# Patient Record
Sex: Male | Born: 1970 | Race: Black or African American | Hispanic: No | Marital: Single | Smoking: Never smoker
Health system: Southern US, Community
[De-identification: ages and names within clinical notes are randomized; demographics above are authoritative.]

## PROBLEM LIST (undated history)

## (undated) DIAGNOSIS — E785 Hyperlipidemia, unspecified: Secondary | ICD-10-CM

## (undated) DIAGNOSIS — I2510 Atherosclerotic heart disease of native coronary artery without angina pectoris: Secondary | ICD-10-CM

## (undated) DIAGNOSIS — I219 Acute myocardial infarction, unspecified: Secondary | ICD-10-CM

## (undated) DIAGNOSIS — I10 Essential (primary) hypertension: Secondary | ICD-10-CM

## (undated) DIAGNOSIS — K219 Gastro-esophageal reflux disease without esophagitis: Secondary | ICD-10-CM

## (undated) HISTORY — DX: Atherosclerotic heart disease of native coronary artery without angina pectoris: I25.10

## (undated) HISTORY — DX: Gastro-esophageal reflux disease without esophagitis: K21.9

## (undated) HISTORY — DX: Essential (primary) hypertension: I10

## (undated) HISTORY — DX: Acute myocardial infarction, unspecified: I21.9

## (undated) HISTORY — DX: Hyperlipidemia, unspecified: E78.5

## (undated) HISTORY — PX: CARDIAC CATHETERIZATION: SHX172

---

## 2013-09-20 ENCOUNTER — Emergency Department: Payer: Self-pay | Admitting: Emergency Medicine

## 2013-09-20 LAB — URINALYSIS, COMPLETE
Bacteria: NONE SEEN
Bilirubin,UR: NEGATIVE
Glucose,UR: NEGATIVE mg/dL (ref 0–75)
Ketone: NEGATIVE
NITRITE: NEGATIVE
Ph: 5 (ref 4.5–8.0)
Protein: NEGATIVE
RBC,UR: 1 /HPF (ref 0–5)
SPECIFIC GRAVITY: 1.016 (ref 1.003–1.030)
WBC UR: 11 /HPF (ref 0–5)

## 2013-09-20 LAB — CBC WITH DIFFERENTIAL/PLATELET
Basophil #: 0.1 10*3/uL (ref 0.0–0.1)
Basophil %: 1.1 %
Eosinophil #: 0.3 10*3/uL (ref 0.0–0.7)
Eosinophil %: 3.8 %
HCT: 51 % (ref 40.0–52.0)
HGB: 16.6 g/dL (ref 13.0–18.0)
LYMPHS ABS: 2.5 10*3/uL (ref 1.0–3.6)
Lymphocyte %: 37 %
MCH: 29.8 pg (ref 26.0–34.0)
MCHC: 32.6 g/dL (ref 32.0–36.0)
MCV: 92 fL (ref 80–100)
Monocyte #: 0.9 x10 3/mm (ref 0.2–1.0)
Monocyte %: 13.3 %
Neutrophil #: 3 10*3/uL (ref 1.4–6.5)
Neutrophil %: 44.8 %
Platelet: 243 10*3/uL (ref 150–440)
RBC: 5.58 10*6/uL (ref 4.40–5.90)
RDW: 13.7 % (ref 11.5–14.5)
WBC: 6.7 10*3/uL (ref 3.8–10.6)

## 2013-09-20 LAB — BASIC METABOLIC PANEL
ANION GAP: 8 (ref 7–16)
BUN: 14 mg/dL (ref 7–18)
CHLORIDE: 106 mmol/L (ref 98–107)
CREATININE: 1.13 mg/dL (ref 0.60–1.30)
Calcium, Total: 8.8 mg/dL (ref 8.5–10.1)
Co2: 23 mmol/L (ref 21–32)
EGFR (Non-African Amer.): >60
Glucose: 89 mg/dL (ref 65–99)
Osmolality: 274 (ref 275–301)
Potassium: 3.6 mmol/L (ref 3.5–5.1)
SODIUM: 137 mmol/L (ref 136–145)

## 2013-10-28 ENCOUNTER — Ambulatory Visit: Payer: Self-pay | Admitting: Family Medicine

## 2014-01-25 HISTORY — PX: CARDIAC CATHETERIZATION: SHX172

## 2014-02-04 LAB — BASIC METABOLIC PANEL
ANION GAP: 10 (ref 7–16)
BUN: 13 mg/dL (ref 7–18)
CHLORIDE: 107 mmol/L (ref 98–107)
Calcium, Total: 8.1 mg/dL — ABNORMAL LOW (ref 8.5–10.1)
Co2: 23 mmol/L (ref 21–32)
Creatinine: 1.21 mg/dL (ref 0.60–1.30)
EGFR (African American): >60
EGFR (Non-African Amer.): >60
Glucose: 156 mg/dL — ABNORMAL HIGH (ref 65–99)
Osmolality: 283 (ref 275–301)
Potassium: 3.4 mmol/L — ABNORMAL LOW (ref 3.5–5.1)
SODIUM: 140 mmol/L (ref 136–145)

## 2014-02-04 LAB — CBC
HCT: 45.2 % (ref 40.0–52.0)
HGB: 14.7 g/dL (ref 13.0–18.0)
MCH: 29.5 pg (ref 26.0–34.0)
MCHC: 32.5 g/dL (ref 32.0–36.0)
MCV: 91 fL (ref 80–100)
Platelet: 234 10*3/uL (ref 150–440)
RBC: 4.98 10*6/uL (ref 4.40–5.90)
RDW: 13.6 % (ref 11.5–14.5)
WBC: 6.9 10*3/uL (ref 3.8–10.6)

## 2014-02-04 LAB — TROPONIN I: Troponin-I: 0.23 ng/mL — ABNORMAL HIGH

## 2014-02-05 ENCOUNTER — Inpatient Hospital Stay: Payer: Self-pay | Admitting: Internal Medicine

## 2014-02-05 DIAGNOSIS — I10 Essential (primary) hypertension: Secondary | ICD-10-CM

## 2014-02-05 DIAGNOSIS — I214 Non-ST elevation (NSTEMI) myocardial infarction: Secondary | ICD-10-CM

## 2014-02-05 DIAGNOSIS — I517 Cardiomegaly: Secondary | ICD-10-CM

## 2014-02-05 DIAGNOSIS — I2510 Atherosclerotic heart disease of native coronary artery without angina pectoris: Secondary | ICD-10-CM

## 2014-02-05 LAB — TROPONIN I
TROPONIN-I: 0.23 ng/mL — AB
Troponin-I: 0.22 ng/mL — ABNORMAL HIGH

## 2014-02-05 LAB — HEMOGLOBIN A1C: HEMOGLOBIN A1C: 6 % (ref 4.2–6.3)

## 2014-02-06 LAB — HEMOGLOBIN A1C: HEMOGLOBIN A1C: 6.1 % (ref 4.2–6.3)

## 2014-02-06 LAB — BASIC METABOLIC PANEL
Anion Gap: 6 — ABNORMAL LOW (ref 7–16)
BUN: 14 mg/dL (ref 7–18)
CREATININE: 1.19 mg/dL (ref 0.60–1.30)
Calcium, Total: 8.7 mg/dL (ref 8.5–10.1)
Chloride: 108 mmol/L — ABNORMAL HIGH (ref 98–107)
Co2: 25 mmol/L (ref 21–32)
EGFR (Non-African Amer.): >60
Glucose: 112 mg/dL — ABNORMAL HIGH (ref 65–99)
Osmolality: 279 (ref 275–301)
POTASSIUM: 4.1 mmol/L (ref 3.5–5.1)
SODIUM: 139 mmol/L (ref 136–145)

## 2014-02-09 ENCOUNTER — Encounter: Payer: Self-pay | Admitting: *Deleted

## 2014-02-21 ENCOUNTER — Encounter: Payer: Self-pay | Admitting: Cardiovascular Disease

## 2014-02-21 ENCOUNTER — Ambulatory Visit (INDEPENDENT_AMBULATORY_CARE_PROVIDER_SITE_OTHER): Payer: Medicaid Other | Admitting: Cardiovascular Disease

## 2014-02-21 VITALS — BP 124/98 | HR 57 | Ht 71.0 in | Wt 224.0 lb

## 2014-02-21 DIAGNOSIS — Z9889 Other specified postprocedural states: Secondary | ICD-10-CM

## 2014-02-21 DIAGNOSIS — I2510 Atherosclerotic heart disease of native coronary artery without angina pectoris: Secondary | ICD-10-CM

## 2014-02-21 DIAGNOSIS — I10 Essential (primary) hypertension: Secondary | ICD-10-CM | POA: Insufficient documentation

## 2014-02-21 DIAGNOSIS — E785 Hyperlipidemia, unspecified: Secondary | ICD-10-CM | POA: Insufficient documentation

## 2014-02-21 NOTE — Progress Notes (Signed)
Primary care physician: Dr. Letta PateAycock  HPI  This is a pleasant 43 year old African-American male who is here today for follow-up visit after recent hospitalization [facility] for a small non-ST elevation myocardial infarction. He has known history of hypertension and hyperlipidemia. He was under the impression that he received 2 cardiac stents in the past in a different state. However, recent cardiac catheterization showed no evidence of prior stenting. He presented with chest pain and mildly elevated troponin consistent with a small non-ST elevation myocardial infarction. Echocardiogram showed normal LV systolic function with no significant valvular abnormalities. I proceeded with cardiac catheterization via the right radial artery which showed no evidence of obstructive coronary artery disease. There was significant coronary ectasia especially involving the right coronary artery with sluggish coronary flow. He was treated with dual antiplatelet therapy. The dose of atorvastatin was increased. He has been doing well since hospital discharge and denies any recurrent chest pain or dyspnea. 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  No Known Allergies   Current Outpatient Prescriptions on File Prior to Visit  Medication Sig Dispense Refill  . amLODipine (NORVASC) 5 MG tablet Take 5 mg by mouth daily.    Marland Kitchen. aspirin 81 MG tablet Take 81 mg by mouth daily.    Marland Kitchen. atorvastatin (LIPITOR) 80 MG tablet Take 80 mg by mouth daily.    . metoprolol (LOPRESSOR) 50 MG tablet Take 50 mg by mouth every 12 (twelve) hours.    . nitroGLYCERIN (NITROSTAT) 0.4 MG SL tablet Place 0.4 mg under the tongue every 5 (five) minutes as needed for chest pain.    Marland Kitchen. omeprazole (PRILOSEC) 20 MG capsule Take 20 mg by mouth 2 (two) times daily before a meal.    . ticagrelor (BRILINTA) 90 MG TABS tablet Take 90 mg by mouth every 12 (twelve) hours.     No current facility-administered medications on file prior to visit.     Past  Medical History  Diagnosis Date  . MI (myocardial infarction)   . Gastro-esophageal reflux   . Coronary artery disease     Small non-ST elevation myocardial infarction in December 2015. Cardiac catheterization showed no evidence of obstructive coronary artery disease. Significant coronary ectasia was noted especially in the right coronary artery with TIMI 2 flow. Ejection fraction was 55%.  Marland Kitchen. Hyperlipidemia   . Essential hypertension      Past Surgical History  Procedure Laterality Date  . Cardiac catheterization      [facility]  . Cardiac catheterization  01/2014     Family History  Problem Relation Age of Onset  . Heart disease Mother      History   Social History  . Marital Status: Single    Spouse Name: N/A    Number of Children: N/A  . Years of Education: N/A   Occupational History  . Not on file.   Social History Main Topics  . Smoking status: Never Smoker   . Smokeless tobacco: Not on file  . Alcohol Use: Yes     Comment: socially  . Drug Use: No  . Sexual Activity: Not on file   Other Topics Concern  . Not on file   Social History Narrative      PHYSICAL EXAM   BP 124/98 mmHg  Pulse 57  Ht 5\' 11"  (1.803 m)  Wt 224 lb (101.606 kg)  BMI 31.26 kg/m2 Constitutional: He is oriented to person, place, and time. He appears well-developed and  well-nourished. No distress.  HENT: No nasal discharge.  Head: Normocephalic and atraumatic.  Eyes: Pupils are equal and round.  No discharge. Neck: Normal range of motion. Neck supple. No JVD present. No thyromegaly present.  Cardiovascular: Normal rate, regular rhythm, normal heart sounds. Exam reveals no gallop and no friction rub. No murmur heard.  Pulmonary/Chest: Effort normal and breath sounds normal. No stridor. No respiratory distress. He has no wheezes. He has no rales. He exhibits no tenderness.  Abdominal: Soft. Bowel sounds are normal. He exhibits no  distension. There is no tenderness. There is no rebound and no guarding.  Musculoskeletal: Normal range of motion. He exhibits no edema and no tenderness.  Neurological: He is alert and oriented to person, place, and time. Coordination normal.  Skin: Skin is warm and dry. No rash noted. He is not diaphoretic. No erythema. No pallor.  Psychiatric: He has a normal mood and affect. His behavior is normal. Judgment and thought content normal.  Right radial pulse is normal with no hematoma     NWG:NFAOZEKG:Sinus  Bradycardia  -  Nonspecific T-abnormality.   ABNORMAL     ASSESSMENT AND PLAN

## 2014-02-21 NOTE — Assessment & Plan Note (Signed)
The dose of atorvastatin was recently increased. I recommend a follow-up lipid and liver profile  in one month.

## 2014-02-21 NOTE — Assessment & Plan Note (Signed)
Blood pressure is well controlled on current medications. 

## 2014-02-21 NOTE — Assessment & Plan Note (Signed)
He is overall doing reasonably well since hospital discharge with no recurrent symptoms suggestive of angina. I recommend aggressive control of risk factors. Continue dual antiplatelet therapy for at least one year.

## 2014-02-21 NOTE — Patient Instructions (Signed)
Your physician recommends that you return for lab work in:  Fasting lipid and liver panel in 4 weeks   Your physician recommends that you schedule a follow-up appointment in:  3 months with Dr. Kirke CorinArida

## 2014-03-21 ENCOUNTER — Other Ambulatory Visit (INDEPENDENT_AMBULATORY_CARE_PROVIDER_SITE_OTHER): Payer: Medicaid Other | Admitting: *Deleted

## 2014-03-21 DIAGNOSIS — E785 Hyperlipidemia, unspecified: Secondary | ICD-10-CM

## 2014-03-22 LAB — HEPATIC FUNCTION PANEL
ALBUMIN: 4.7 g/dL (ref 3.5–5.5)
ALK PHOS: 59 IU/L (ref 39–117)
ALT: 25 IU/L (ref 0–44)
AST: 21 IU/L (ref 0–40)
Bilirubin, Direct: 0.1 mg/dL (ref 0.00–0.40)
Total Bilirubin: 0.3 mg/dL (ref 0.0–1.2)
Total Protein: 7.9 g/dL (ref 6.0–8.5)

## 2014-03-22 LAB — LIPID PANEL
CHOLESTEROL TOTAL: 200 mg/dL — AB (ref 100–199)
Chol/HDL Ratio: 4.7 ratio units (ref 0.0–5.0)
HDL: 43 mg/dL (ref >39)
LDL Calculated: 131 mg/dL — ABNORMAL HIGH (ref 0–99)
Triglycerides: 130 mg/dL (ref 0–149)
VLDL Cholesterol Cal: 26 mg/dL (ref 5–40)

## 2014-05-17 ENCOUNTER — Encounter: Payer: Self-pay | Admitting: Cardiovascular Disease

## 2014-05-17 ENCOUNTER — Ambulatory Visit (INDEPENDENT_AMBULATORY_CARE_PROVIDER_SITE_OTHER): Payer: Medicaid Other | Admitting: Cardiovascular Disease

## 2014-05-17 VITALS — BP 128/100 | HR 72 | Ht 71.0 in | Wt 228.8 lb

## 2014-05-17 DIAGNOSIS — I10 Essential (primary) hypertension: Secondary | ICD-10-CM | POA: Diagnosis not present

## 2014-05-17 DIAGNOSIS — E785 Hyperlipidemia, unspecified: Secondary | ICD-10-CM

## 2014-05-17 DIAGNOSIS — I2510 Atherosclerotic heart disease of native coronary artery without angina pectoris: Secondary | ICD-10-CM

## 2014-05-17 MED ORDER — CLOPIDOGREL BISULFATE 75 MG PO TABS: 75.0000 mg | ORAL_TABLET | Freq: Every day | ORAL | Status: DC

## 2014-05-17 NOTE — Patient Instructions (Signed)
Stop taking Brilinta.  Start Plavix 75 mg once daily.   Follow a low fat low carb diet. Come fasting next visit in order to do labs.   Your physician wants you to follow-up in: 6 months.  You will receive a reminder letter in the mail two months in advance. If you don't receive a letter, please call our office to schedule the follow-up appointment.

## 2014-05-17 NOTE — Progress Notes (Signed)
Primary care physician: Dr. Letta Pate  HPI  This is a pleasant 44 year old African-American male who is here today for a follow-up visit regarding coronary artery disease.  He has known history of hypertension and hyperlipidemia. He was under the impression that he received 2 cardiac stents in the past in a different state. However, recent cardiac catheterization showed no evidence of prior stenting. He was hospitalized in December 2015 at [facility] for chest pain and mildly elevated troponin . Echocardiogram showed normal LV systolic function with no significant valvular abnormalities. Cardiac catheterization showed no evidence of obstructive coronary artery disease. There was significant coronary ectasia especially involving the right coronary artery with sluggish coronary flow. He was treated with dual antiplatelet therapy. The dose of atorvastatin was increased.  He has been doing very well since then with no recurrent chest pain or shortness of breath. He forgets to take the PM doses of his medications including metoprolol and Brilinta.   No Known Allergies   Current Outpatient Prescriptions on File Prior to Visit  Medication Sig Dispense Refill  . amLODipine (NORVASC) 5 MG tablet Take 5 mg by mouth daily.    Marland Kitchen aspirin 81 MG tablet Take 81 mg by mouth daily.    Marland Kitchen atorvastatin (LIPITOR) 80 MG tablet Take 80 mg by mouth daily.    . metoprolol (LOPRESSOR) 50 MG tablet Take 50 mg by mouth daily.     . nitroGLYCERIN (NITROSTAT) 0.4 MG SL tablet Place 0.4 mg under the tongue every 5 (five) minutes as needed for chest pain.    Marland Kitchen omeprazole (PRILOSEC) 20 MG capsule Take 20 mg by mouth 2 (two) times daily before a meal.    . ticagrelor (BRILINTA) 90 MG TABS tablet Take 90 mg by mouth daily.      No current facility-administered medications on file prior to visit.     Past Medical History  Diagnosis Date  . MI (myocardial infarction)   . Gastro-esophageal reflux   . Coronary artery disease    Small non-ST elevation myocardial infarction in December 2015. Cardiac catheterization showed no evidence of obstructive coronary artery disease. Significant coronary ectasia was noted especially in the right coronary artery with TIMI 2 flow. Ejection fraction was 55%.  Marland Kitchen Hyperlipidemia   . Essential hypertension      Past Surgical History  Procedure Laterality Date  . Cardiac catheterization      x1 stent; [facility]  . Cardiac catheterization  01/2014     Family History  Problem Relation Age of Onset  . Heart disease Mother      History   Social History  . Marital Status: Single    Spouse Name: N/A  . Number of Children: N/A  . Years of Education: N/A   Occupational History  . Not on file.   Social History Main Topics  . Smoking status: Never Smoker   . Smokeless tobacco: Not on file  . Alcohol Use: Yes     Comment: socially  . Drug Use: No  . Sexual Activity: Not on file   Other Topics Concern  . Not on file   Social History Narrative      PHYSICAL EXAM   BP 128/100 mmHg  Pulse 72  Ht  (1.803 m)  Wt 228 lb 12 oz (103.76 kg)  BMI 31.92 kg/m2 Constitutional: He is oriented to person, place, and time. He appears well-developed and well-nourished. No distress.  HENT: No nasal discharge.  Head: Normocephalic and atraumatic.  Eyes: Pupils are equal and round.  No discharge. Neck: Normal range of motion. Neck supple. No JVD present. No thyromegaly present.  Cardiovascular: Normal rate, regular rhythm, normal heart sounds. Exam reveals no gallop and no friction rub. No murmur heard.  Pulmonary/Chest: Effort normal and breath sounds normal. No stridor. No respiratory distress. He has no wheezes. He has no rales. He exhibits no tenderness.  Abdominal: Soft. Bowel sounds are normal. He exhibits no distension. There is no tenderness. There is no rebound and no guarding.  Musculoskeletal: Normal range of motion. He exhibits no  edema and no tenderness.  Neurological: He is alert and oriented to person, place, and time. Coordination normal.  Skin: Skin is warm and dry. No rash noted. He is not diaphoretic. No erythema. No pallor.  Psychiatric: He has a normal mood and affect. His behavior is normal. Judgment and thought content normal.         ASSESSMENT AND PLAN

## 2014-05-18 NOTE — Assessment & Plan Note (Signed)
He is doing very well with no symptoms suggestive of angina. Continue medical therapy. I switched him from Brilinta to Plavix 75 mg once daily.

## 2014-05-18 NOTE — Assessment & Plan Note (Signed)
Blood pressure is reasonably controlled on current medications. 

## 2014-05-18 NOTE — Assessment & Plan Note (Signed)
Last lipid profile was not optimal in spite of maximal dose Atorvastatin. I discussed with him the importance of diet and exercise. Repeat labs in 6 months. If no improvement , I will plan on adding Zetia.

## 2014-05-21 ENCOUNTER — Emergency Department: Payer: Self-pay | Admitting: Emergency Medicine

## 2014-05-21 LAB — BASIC METABOLIC PANEL
ANION GAP: 8 (ref 7–16)
BUN: 15 mg/dL
CALCIUM: 8.9 mg/dL
CO2: 25 mmol/L
CREATININE: 1.04 mg/dL
Chloride: 104 mmol/L
GLUCOSE: 113 mg/dL — AB
POTASSIUM: 3.6 mmol/L
Sodium: 137 mmol/L

## 2014-05-21 LAB — CBC WITH DIFFERENTIAL/PLATELET
Basophil #: 0.1 10*3/uL (ref 0.0–0.1)
Basophil %: 1 %
EOS PCT: 2.8 %
Eosinophil #: 0.2 10*3/uL (ref 0.0–0.7)
HCT: 45.2 % (ref 40.0–52.0)
HGB: 14.8 g/dL (ref 13.0–18.0)
LYMPHS PCT: 28.1 %
Lymphocyte #: 2.1 10*3/uL (ref 1.0–3.6)
MCH: 29.2 pg (ref 26.0–34.0)
MCHC: 32.8 g/dL (ref 32.0–36.0)
MCV: 89 fL (ref 80–100)
MONOS PCT: 10.2 %
Monocyte #: 0.8 x10 3/mm (ref 0.2–1.0)
NEUTROS ABS: 4.4 10*3/uL (ref 1.4–6.5)
Neutrophil %: 57.9 %
Platelet: 232 10*3/uL (ref 150–440)
RBC: 5.08 10*6/uL (ref 4.40–5.90)
RDW: 14.2 % (ref 11.5–14.5)
WBC: 7.6 10*3/uL (ref 3.8–10.6)

## 2014-05-21 LAB — TROPONIN I
TROPONIN-I: 0.05 ng/mL — AB
TROPONIN-I: 0.07 ng/mL — AB

## 2014-05-23 ENCOUNTER — Telehealth: Payer: Self-pay | Admitting: *Deleted

## 2014-05-23 NOTE — Telephone Encounter (Signed)
Request for surgical clearance:  1. What type of surgery is being performed? Micro disctec laryngoscopy with biopsy   2. When is this surgery scheduled? 05/25/14 (it was moved up)  3. Are there any medications that need to be held prior to surgery and how long? Will be off all Asprin and any blood thinner  4. Name of physician performing surgery? Dr Andee Polesvaught  5. What is your office phone and fax number? [phone] FAX: [phone].

## 2014-05-23 NOTE — Telephone Encounter (Signed)
Clearance letter faxed to [facility]

## 2014-05-23 NOTE — Telephone Encounter (Signed)
Cardiac clearance letter faxed to [facility]

## 2014-06-02 ENCOUNTER — Ambulatory Visit
Admit: 2014-06-02 | Disposition: A | Discharge status: Home / Self Care | Payer: Self-pay | Attending: Otolaryngology | Admitting: Otolaryngology

## 2014-06-18 NOTE — Discharge Summary (Signed)
PATIENT NAME:  Adrian CampbellMITCHELL, Rasmus S MR#:  [id number] DATE OF BIRTH:  Jan 08, 1971  DATE OF ADMISSION:  02/05/2014 DATE OF DISCHARGE:  02/07/2014  DISCHARGE DIAGNOSES: 1.  Chest pain secondary to non-ST-elevation myocardial infarction.  2.  Hypertension.   DISCHARGE MEDICATIONS: 1.  Aspirin 81 mg daily.  2.  Omeprazole 20 mg p.o. b.i.d. 3.  Amlodipine 5 mg daily. 4.  Nitroglycerin 0.4 mg sublingual every 5 minutes p.r.n. for chest pain as needed.  5.  Atorvastatin 80 mg p.o. daily. 6.  Metoprolol tartrate 50 mg p.o. b.i.d. 7.  Brilinta 90 mg every 12 hours.   DISCHARGE DIET: Low-sodium, low-fat diet.  DISCHARGE FOLLOWUP: Follow up with Dr. Kirke CorinArida in about 7 to 10 days. He has an appointment with Dr. Kirke CorinArida on December 28th at 3:15 p.m.  PRIMARY CARE PHYSICIAN: [facility].  CONSULTATIONS: Cardiology with Dr. Lorine BearsMuhammad Arida.  HOSPITAL COURSE: This is a 44 year old African American male with hypertension and coronary artery disease with stent placement 2 years ago who comes in because of chest pain for 3 days. The patient also had some trouble breathing with chest pain. The patient was given nitro by EMS with some relief of chest pain. He had a troponin elevation of 0.23 initially, and the patient admitted to medical service for chest pain, non-ST elevation MI. He was continued on aspirin, beta blockers and statins and admitted for acute coronary syndrome. The patient was seen by cardiology, from [facility]. He was continued on Lovenox, full dose, over the weekend, and he was taken to cardiac cath on Monday, December 14th. The patient's cardiac cath revealed EF of 55%, but sluggish flow in the carotid arteries seen so Dr. Kirke CorinArida recommended dual antiplatelet therapy with aspirin and Brilinta. The patient did not have any occlusive coronary artery disease. The patient was chest pain free on the day of admission itself. The patient's troponins were around 0.22 and did not  go up further than that. The patient's CBC and Chem-7 were within normal limits with normal kidney function. Hemoglobin A1c was 6. The patient's EF was 50% by echo, low normal systolic function. The patient was given a prescription for Brilinta and increased dose of Lipitor at 80 mg and also made an appointment to follow up with Dr. Kirke CorinArida. The patient takes amlodipine at 5 mg at home for the blood pressure and Lipitor 10 mg daily. We have increased Lipitor to 80 mg due to his findings on the cath and we have added metoprolol also to his blood pressure medication in addition to the amlodipine that he is taking. The patient's was tachycardic, 103, on admission. The patient felt better.  DISCHARGE PHYSICAL EXAMINATION:  DISCHARGE VITALS: Temperature 97.7, heart rate 54, blood pressure 143/80, sats 97% on room air. CARDIOVASCULAR: S1 and S2 regular.  LUNGS: Clear to auscultation. No wheeze. No rales.  ABDOMEN: Soft, nontender, nondistended. Bowel sounds present.   DISPOSITION: The patient went home in stable condition.  TIME SPENT ON DISCHARGE: More than 30 minutes. ____________________________ Katha HammingSnehalatha Breanda Greenlaw, MD sk:sb D: 02/08/2014 07:21:25 ET T: 02/08/2014 10:58:54 ET JOB#: [id number]  cc: Katha HammingSnehalatha Callahan Wild, MD, <Dictator> Muhammad A. Kirke CorinArida, MD Katha HammingSNEHALATHA Jennica Tagliaferri MD ELECTRONICALLY SIGNED 02/08/2014 22:55

## 2014-06-18 NOTE — H&P (Signed)
PATIENT NAME:  Adrian Terry, Adrian Terry MR#:  [id number] DATE OF BIRTH:  22-May-1970  DATE OF ADMISSION:  02/05/2014  REFERRING DOCTOR: Darien Ramus, MD  PRIMARY CARE DOCTOR:  Karie Fetch, MD of [facility].    ADMITTING DOCTOR:  Crissie Figures, MD   CHIEF COMPLAINT:  Chest pain ongoing for the past 3 days.    HISTORY OF PRESENT ILLNESS: A 44 year old African American male with past medical history significant for hypertension, coronary artery disease, status post stent placement 2 years ago, history of gastroesophageal reflux disease, hyperlipidemia who presents to the Emergency Room with the complaints of ongoing chest pain for the past 3 days. The patient states that he was in his usual state of health until 3 days ago when he started having retrosternal chest pain associated with some mild shortness of breath and nausea for which he has not sought any medical attention. He continued to have these chest pain episodes on and off for the past 3 days, which kind of increased today, hence he called EMS.  He took 1 nitroglycerin prior to the arrival of the EMS and he also received 1 more nitroglycerin tablet, which was given by the EMS, and also received aspirin following which his chest pain eased. He also complains of some tingling along the left arm with associated chest pain. No dizziness. No loss of consciousness. No diaphoresis. No history of any recent fever, cough. He does have some nausea but no vomiting, no diarrhea. No urinary symptoms.   In the Emergency Room, the patient was evaluated by the ED physician and was given nitroglycerin paste for his chest discomfort following which his chest discomfort completely  resolved. The patient also received some Zofran for the nausea and his nausea is under control.  At the current time, the patient states he is feeling better and denies any chest pain at this time. Workup in the Emergency Room by the ED physician revealed EKG with  a sinus tachycardia with ventricular rate of 105 beats per minute and nonspecific ST, T changes and troponin was mildly elevated with 0.23. Other labs were unremarkable except for potassium of 3.4. Hospitalist service was consulted for further evaluation and management.    PAST MEDICAL HISTORY:  1.  Hypertension.  2.  Coronary artery disease, status post stent placed 2 years ago.  3.  Gastroesophageal reflux disease.  4.  Hyperlipidemia.   PAST SURGICAL HISTORY: Nil significant.   ALLERGIES: No known drug allergies.   HOME MEDICATIONS:  1.  Amlodipine 5 mg tablet, 1 tablet orally once a day.  2.  Aspirin 81 mg, 1 tablet orally once a day.  3.  Lipitor 10 mg tablet, 1 tablet orally once a day.  4.  Nitroglycerin 0.4 mg subinguinal tablet, 1 tablet as needed for chest pain.  5.  Omeprazole 20 mg tablet, 1 tablet orally once a day.   FAMILY HISTORY: Mother significant for heart disease and diabetes mellitus and father with diabetes mellitus.    SOCIAL HISTORY: He is engaged. He works as an Personnel officer, an ex-smoker. He quit a few years ago but restarted recently and planning to quit again. History of occasional alcohol intake on weekends socially. No history of any substance abuse.    REVIEW OF SYSTEMS:  CONSTITUTIONAL: Negative for fever, fatigue, generalized weakness. No weight loss or weight gain abnormally lately.  EYES: Negative for blurred vision, double vision. No pain. No redness. No inflammation.  EARS, NOSE, AND THROAT: Negative  for tinnitus, ear pain, hearing loss, epistaxis, nasal discharge, difficulty swallowing.  RESPIRATORY: Negative for cough, wheezing, hemoptysis. He did have some mild shortness of breath during the chest pain episode. Otherwise, denies any shortness of breath or painful respiration.  CARDIOVASCULAR: Ongoing chest discomfort as noted in the HPI. No dizziness. No syncopal episodes. No palpitations.  GASTROINTESTINAL: Some mild nausea with the chest pain;  otherwise, no vomiting. No diarrhea. No abdominal pain. No hematemesis. No melena. History of gastroesophageal reflux disease controlled with daily omeprazole.  GENITOURINARY: Negative for dysuria, hematuria, frequency, urgency.  ENDOCRINE: Negative for polyuria, nocturia. No heat or cold intolerance.  HEMATOLOGIC: Negative for anemia, easy bruising, bleeding or swollen glands.  INTEGUMENTARY: Negative for acne, skin rash, lesions.  MUSCULOSKELETAL: Negative for any arthritis, joint swelling or gout.  NEUROLOGICAL: Negative for focal weakness or numbness. No history of CVA, TIA, seizure disorder.  PSYCHIATRIC: Negative for anxiety, depression or insomnia.   PHYSICAL EXAMINATION:  VITAL SIGNS: Temperature 98.5 degrees Fahrenheit, pulse rate 103 per minute, respirations 20, blood pressure 146/75;  O2 saturations 100% on room air.  GENERAL: Well-nourished, well-developed young male, alert and oriented, in no acute distress, comfortably resting in the bed, pleasant and cooperative.  HEAD: Atraumatic, normocephalic.  EYES: Pupils equal, reacting to light and accommodation, no conjunctival pallor, no scleral icterus, extraocular movements intact.  NOSE: No nasal lesions. No drainage.  EARS: No drainage. No external lesions.  ORAL CAVITY: No mucosal lesions. No exudates. No masses.   NECK: Supple. No JVD. No thyromegaly. No carotid bruit. Range of motion of neck normal.  RESPIRATORY: Good respiratory effort. Not using accessory muscles of respiration. Bilateral vesicular breath sounds. No rales or rhonchi.  CARDIOVASCULAR: Heart sounds regular. S1, S2 regular. No murmur, gallop, or clicks heard. Peripheral pulses equal at carotid, femoral, and pedal pulses. No peripheral edema.  GASTROINTESTINAL: Abdomen soft and nontender, bowel sounds present and equal in all 4 quadrants. No tenderness. No guarding. No rigidity. No hepatosplenomegaly.  GENITOURINARY: Deferred.  MUSCULOSKELETAL: No tenderness or  joint effusions. Range of motion adequate in all areas. Strength and tone equal bilaterally.  SKIN: Within normal limits. Well hydrated.  LYMPHATIC: No cervical lymphadenopathy.  VASCULAR: Good dorsalis pedis and posterior tibial pulses.  NEUROLOGICAL: Alert, awake, and oriented x 3. Cranial nerves II through XII grossly intact. DTRs  2+ bilateral and symmetrical in both upper and lower extremities. Motor strength 5/5 in both upper and lower extremities and symmetrical.  PSYCHIATRIC: Judgment, insight adequate. Alert and oriented x 3. Memory and mood intact, within normal limits.   LABORATORY DATA: Serum glucose 156, BUN 13, creatinine 1.21, serum sodium 140, potassium 3.4, serum chloride 107, bicarbonate 23, serum calcium 8.1, troponin 0.23, WBC 6.9, hemoglobin 14.7, hematocrit 45.2, platelet count 234.   IMAGING STUDY: Chest x-ray: No active disease.   EKG: Sinus tachycardia with a ventricular rate of 105 beats per minute, nonspecific ST, T wave changes.   ASSESSMENT AND PLAN: A 44 year old African American male with a past medical history significant for coronary artery disease, status post stent placement 2 years ago, history of hypertension, hyperlipidemia, gastroesophageal reflux disease, tobacco usage presents with the complaints of ongoing chest pain for the past 3-4 days.  1.  Chest pain ongoing for the past 3-4 days, which has typical and atypical characteristics. The patient has significant CAD risk factors including known coronary artery disease and hypertension, hyperlipidemia, tobacco usage, needs further evaluation to rule out acute coronary syndrome. Plan: Admit to telemetry, aspirin, nitroglycerin, beta  blocker, statin, cycle cardiac enzymes, order echocardiogram, and cardiology consultation for further evaluation.  2.  Elevated troponin, mild. Demand ischemia, rule out acute coronary syndrome. Plan: Admit to telemetry, cycle cardiac enzymes, aspirin, beta blocker, statin,  nitroglycerin, echocardiogram, and cardiology consultation.  3.  History of coronary artery disease, status post stent placement 2 years ago.  4.  Hypertension. BP controlled on home medications. Continue same.  5.  Hyperlipidemia, on statin. Continue same.  6.  History of gastroesophageal reflux disease, stable on proton pump inhibitor. Continue same.  7.  Tobacco usage, intermittent. Counseled to quit smoking. Offered nicotine replacement therapy. The patient defers at this time.  8.  Hypokalemia, mild. Replace potassium and follow BMP.  9.  Elevated blood sugars, family history of diabetes. Check hemoglobin A1c to evaluate for diabetes mellitus.  10.  Deep vein thrombosis prophylaxis, subcutaneous Lovenox.  11.  Gastrointestinal prophylaxis, proton pump inhibitor.  12.  Code status.  Full code.   TIME SPENT: 55 minutes.    ____________________________ Crissie Figures, MD enr:AT D: 02/05/2014 00:51:36 ET T: 02/05/2014 01:19:16 ET JOB#: [id number]  cc: Crissie Figures, MD, <Dictator> Karie Fetch, MD, [facility]     Crissie Figures MD ELECTRONICALLY SIGNED 02/05/2014 17:42

## 2014-06-18 NOTE — Consult Note (Signed)
General Aspect ast medical history significant for hypertension, coronary artery disease, status post stent placement 2 years ago, history of gastroesophageal reflux disease, hyperlipidemia who presents to the Emergency Room with the complaints of ongoing chest pain for the past 3 days.  initial presentation was with uncontolled hypertension  acknowledges compliance with meds and CPAP   Present Illness Has prior Stent 2 yrs ago without known heart muscle damage.  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  Hypertension.  2.  Coronary artery disease, status post stent placed 2 years ago.  3.  Gastroesophageal reflux disease.  4.  Hyperlipidemia.  5. treated with CPAP OSA  PAST SURGICAL HISTORY: Nil significant.   HOME MEDICATIONS:  1.  Amlodipine 5 mg tablet, 1 tablet orally once a day.  2.  Aspirin 81 mg, 1 tablet orally once a day.  3.  Lipitor 10 mg tablet, 1 tablet orally once a day.  4.  Nitroglycerin 0.4 mg subinguinal tablet, 1 tablet as needed for chest pain.  5.  Omeprazole 20 mg tablet, 1 tablet orally once a day.   FAMILY HISTORY: Mother significant for heart disease and diabetes mellitus and father with diabetes mellitus.    SOCIAL HISTORY: He is engaged. He works as an Personnel officerelectrician, an ex-smoker. He quit a few years ago but restarted recently and planning to quit again. History of occasional alcohol intake on weekends socially. No history of any substance abuse.    REVIEW OF SYSTEMS:  CONSTITUTIONAL: Negative for fever, fatigue, generalized weakness. No  weight loss or weight gain abnormally lately.  EYES: Negative for blurred vision, double vision. No pain. No redness. No inflammation.  EARS, NOSE, AND THROAT: Negative for tinnitus, ear pain, hearing loss, epistaxis, nasal discharge, difficulty swallowing.  RESPIRATORY: Negative for cough, wheezing, hemoptysis. He did have some mild shortness of breath during the chest pain episode. Otherwise, denies any shortness of breath or painful respiration.  CARDIOVASCULAR: Ongoing chest discomfort as noted in the HPI. No dizziness. No syncopal episodes. No palpitations.  GASTROINTESTINAL: Some mild nausea with the chest pain; otherwise, no vomiting. No diarrhea. No abdominal pain. No hematemesis. No melena. History of gastroesophageal reflux disease controlled with daily omeprazole.  GENITOURINARY: Negative for dysuria, hematuria, frequency, urgency.  ENDOCRINE: Negative for polyuria, nocturia. No heat or cold intolerance.  HEMATOLOGIC: Negative for anemia, easy bruising, bleeding or swollen glands.  INTEGUMENTARY: Negative for acne, skin rash, lesions.  MUSCULOSKELETAL: Negative for any arthritis, joint swelling or gout.  NEUROLOGICAL: Negative for focal weakness or numbness. No history of CVA, TIA,   Physical Exam:  GEN well developed, well nourished, no acute distress   HEENT PERRL   NECK supple  No masses   RESP normal resp effort  clear BS   CARD Regular rate and rhythm   ABD denies tenderness  soft   LYMPH negative neck   EXTR negative cyanosis/clubbing, negative edema   SKIN normal to palpation, No rashes, skin turgor good   NEURO grossly normla with all 4   PSYCH  alert, A+O to time, place, person   Review of Systems:  Subjective/Chief Complaint as above   Medications/Allergies Reviewed no allergies   Family & Social History:  Family and Social History:  Family History Non-Contributory  Coronary Artery Disease  Smoking   Social History positive  tobacco, positive   tobacco (Current within 1 year), negative ETOH   + Tobacco Current (within 1 year)   Place of Living Home   EKG:  EKG Interp. by me  NSR  nml qrs  no acute changes    No Known Allergies:   Vital Signs/Nurse's Notes: **Vital Signs.:   12-Dec-15 13:02  Vital Signs Type Routine  Temperature Temperature (F) 98.1  Pulse Pulse 73  Respirations Respirations 17  Systolic BP Systolic BP 120  Diastolic BP (mmHg) Diastolic BP (mmHg) 75  Mean BP 90  Pulse Ox % Pulse Ox % 95  Pulse Ox Activity Level  At rest  Oxygen Delivery Room Air/ 21 %    Impression NSTEMI CAD w PRIOR STENTING HTN GERD elevate BS   Plan agree with admission 1- heparin/ASA/BB 2- ticagrelor 3- BP control--much better already 4- cath on Monday 5- HgbA1c 6. will watn to simplify HTN regime 7. increase statin to 80 mg reviewed with patient   Electronic Signatures: Duke Salvia (MD)  (Signed 12-Dec-15 14:35)  Authored: General Aspect/Present Illness, History and Physical Exam, Review of System, Family & Social History, Home Medications, EKG , Allergies, Vital Signs/Nurse's Notes, Impression/Plan   Last Updated: 12-Dec-15 14:35 by Duke Salvia (MD)

## 2014-06-20 LAB — SURGICAL PATHOLOGY

## 2014-06-26 NOTE — Consult Note (Signed)
PATIENT NAME:  Adrian Terry, Adrian Terry MR#:  [id number] DATE OF BIRTH:  08-Dec-1970  DATE OF CONSULTATION:  05/21/2014  CONSULTING PHYSICIAN:  Gladstone Pihavid Schaevitz, MD  CONSULTED PHYSICIAN:  Kyung Ruddreighton C. Raivyn Kabler, MD  REASON FOR CONSULTATION: Respiratory distress.   HISTORY OF PRESENT ILLNESS: The patient is a 44 year old gentleman who presented to the Emergency Room this morning with respiratory distress, was found to have stridor. On examination.  CT scan of the neck was ordered and there was concern for polypoid lesion in the midline of the supraglottis.  The patient had significant improvement of symptoms after Decadron injection and I was asked for evaluation.   PAST MEDICAL HISTORY: Hypercholesterolemia, hypertension, GERD.   PAST SURGICAL HISTORY: Cardiac stent placement.   ALLERGIES: No known drug allergies.  CURRENT MEDICATIONS: Ticagrelor, omeprazole, nitroglycerin, metoprolol, atorvastatin, aspirin, amlodipine.  SOCIAL HISTORY:   The patient is a former smoker, currently uses vapor.  Has a fiance.    FAMILY HISTORY:  Negative for any reaction to anesthetics.   PHYSICAL EXAMINATION:  VITAL SIGNS: Temperature 98.3, pulse 65, respirations 16, blood pressure 147/109, pulse oximetry is 97% on room air.  GENERAL:  The patient is laying supine in bed sleeping.  EARS: External EACs are clear.  NOSE: Clear anteriorly. Oral cavity and oropharynx reveals some mildly hypertrophic tonsils but no other significant abnormality.  NECK: Supple. No lymphadenopathy or thyromegaly.   PROCEDURE: Transnasal flexible laryngoscopy.   PREPROCEDURE DIAGNOSIS: Respiratory stress with possible laryngeal lesion.   POSTPROCEDURE DIAGNOSIS:  Respiratory stress with possible laryngeal lesion.  DESCRIPTION OF PROCEDURE: The patient was identified. The [name] nasal cavity was anesthetized with Afrin and viscous lidocaine.  Transnasal flexible laryngoscopy was performed. This demonstrated some purulent drainage coming  down from his nasopharynx and mild erythema in an area of his adenoid tissue.  Posterior oropharynx was clear. The base of tongue is clear. Epiglottis along the lingual surface is clear. On the laryngeal surface of the epiglottis and the anterior commissure is a pedunculated polyp of approximately 1 cm.  It does prolapse at times onto the cords but is easily expressed with exhalation. Airway is widely patent.   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   PLAN: I discussed my findings with the patient and his fiance.  Given his lack of any active symptoms, I feel that he can continue oral steroids.  I would like to get medical clearance regarding his cardiac history and then proceed to the OR within the next several weeks for suspension microlaryngoscopy with excision of the supraglottic lesion. The patient demonstrates understanding and will contact us if any changes arise.  I placed him on Augmentin and a Sterapred DS 6-day taper.     ____________________________ Kyung Ruddreighton C. Kaira Stringfield, MD ccv:DT D: 05/21/2014 12:03:51 ET T: 05/21/2014 13:37:08 ET JOB#: [id number]  cc: Kyung Ruddreighton C. Joaquina Nissen, MD, <Dictator> Kyung RuddREIGHTON C Jarren Para MD ELECTRONICALLY SIGNED 06/08/2014 17:33

## 2014-06-26 NOTE — Op Note (Signed)
PATIENT NAME:  Adrian Terry, Adrian Terry MR#:  [id number] DATE OF BIRTH:  Sep 18, 1970  DATE OF PROCEDURE:  06/02/2014  PREOPERATIVE DIAGNOSES: Laryngeal lesion with episode of respiratory distress.   POSTOPERATIVE DIAGNOSIS: Laryngeal lesion with episode of respiratory distress.   PROCEDURES PERFORMED:  1.  Suspension microlaryngoscopy with micro flap excision of a left true vocal fold lesion.  2.  Suspension microlaryngoscopy with biopsy of right true vocal fold lesion.   SURGEON: Bud Facereighton Faithe Ariola, M.D.   ANESTHESIA: General endotracheal anesthesia.   ESTIMATED BLOOD LOSS: 5 mL.   IV FLUIDS: Please see anesthesia record.   COMPLICATIONS: None.   DRAINS AND STENT PLACEMENTS: None.   SPECIMENS: Right and left true vocal fold lesion.   INDICATIONS FOR PROCEDURE: The patient is a 44 year old male who was seen in the Emergency Room for evaluation of respiratory distress. On CT scan was noted to have what appeared to be at the time a supraglottic lesion of approximately 1 cm in size. Significant improvement of symptoms with steroids was made and therefore an outpatient excision was planned.   OPERATIVE FINDINGS: Large, broad-based multilobulated lesion that is firm involving the entire right true vocal fold from the posterior aspect all the way up into the anterior commissure and some polypoid-appearing lesions in the anterior commissure, and a separate left true vocal fold cystic lesion in the mid cord region was also noticed and excised.   DESCRIPTION OF PROCEDURE: After the patient was identified in holding, benefits and risks of the procedure were discussed and consent was reviewed, the patient was taken to the operating room and placed in the supine position. Given placement of the area of the lesion the patient was intubated with glide laryngoscope and a small 6-0 tube was placed down the posterior aspect of his larynx. At this time, the patient was rotated 90 degrees. A shoulder roll was placed  and a Dedo laryngoscope was inserted in the [name] oral cavity after tooth guard was placed. This was advanced for visualization underneath his epiglottis, which demonstrated a multilobulated mass that was prolapsing down into the [name] airway. At this time, under endoscopic visualization, using a 0 degree endoscope, the mass was further evaluated with suction as well as microforceps. This demonstrated a large attachment along the entire cord, along the vibratory margin of the entire cord, as well as on more of the superior aspect of the anterior cord extending to the anterior commissure. There were some smaller multipolypoid degeneration of the mucosa more anteriorly to this involving the anterior commissure itself. Evaluation of the left true vocal fold revealed a cystic lesion in the mid vocal cord that appeared to be completely separate from the other lesion. At this time, the microscope was brought into the field. Under high-power magnification, using micro forceps, the large pedunculated lesion was grasped and 45 degree up-biting scissors was used to make a mucosal incision along the superior aspect of the lesion. Dissection was carried in the area of the lamina propria more inferiorly and the lesion was removed from the vibratory margin of the vocal cord superiorly all the way to the anterior commissure. Care was taken to avoid injury to the vocal ligament. A more inferior flap of mucosa was draped back over the vibratory margin. Care was taken to avoid injury to the anterior commissure. Using 45 degree up-biting forceps, the polypoid degeneration of the anterior right true vocal fold before the anterior commissure was also debulked. Anterior commissure was not manipulated. At this time, epinephrine-soaked pledgets were  used for hemostasis and attention was directed to the [name] left true vocal fold. The left mid cord was evaluated. This revealed a mid cord cystic structure. Under high-power  magnification, a micro flap excision of this was performed. A sickle knife was used to make a lateral incision to the cystic-appearing structure and then using micro suction the mucosa lining over top of this was separated from the cyst. This actually was more of a solid cystic appearing structure and using micro cups the cystic lesion was removed in 2 pieces and then the more inferior mucosa lining with draped back over this. At this point, epinephrine-soaked pledgets were placed on the [name] larynx. Visualization revealed a widely patent larynx with good mucosal coverage of the vibratory margin on the right side where the lesion was excised from as well as no injury to the anterior commissure. There was small polypoid irritation anterior to the anterior commissure, however, this was left alone. It was felt that resection of this would possibly cause significant webbing of the anterior commissure. At this point, a 0.25 mL of 0.25% lidocaine was sprayed with an atomizer on the [name] larynx, and the patient was transferred to anesthesia after the patient was released from suspension and the mouth guard was removed. The patient tolerated the procedure well.   ____________________________ Kyung Rudd, MD ccv:sb D: 06/02/2014 10:51:31 ET T: 06/02/2014 14:49:55 ET JOB#: [id number]  cc: Kyung Rudd, MD, <Dictator> Kyung Rudd MD ELECTRONICALLY SIGNED 06/08/2014 17:34

## 2015-01-30 ENCOUNTER — Emergency Department
Admission: EM | Admit: 2015-01-30 | Discharge: 2015-01-30 | Disposition: A | Discharge status: Home / Self Care | Payer: No Typology Code available for payment source | Attending: Emergency Medicine | Admitting: Emergency Medicine

## 2015-01-30 ENCOUNTER — Encounter: Payer: Self-pay | Admitting: Emergency Medicine

## 2015-01-30 DIAGNOSIS — W228XXA Striking against or struck by other objects, initial encounter: Secondary | ICD-10-CM | POA: Diagnosis not present

## 2015-01-30 DIAGNOSIS — Y9389 Activity, other specified: Secondary | ICD-10-CM | POA: Diagnosis not present

## 2015-01-30 DIAGNOSIS — Y998 Other external cause status: Secondary | ICD-10-CM | POA: Diagnosis not present

## 2015-01-30 DIAGNOSIS — Y9289 Other specified places as the place of occurrence of the external cause: Secondary | ICD-10-CM | POA: Diagnosis not present

## 2015-01-30 DIAGNOSIS — E785 Hyperlipidemia, unspecified: Secondary | ICD-10-CM | POA: Diagnosis not present

## 2015-01-30 DIAGNOSIS — Z7902 Long term (current) use of antithrombotics/antiplatelets: Secondary | ICD-10-CM | POA: Insufficient documentation

## 2015-01-30 DIAGNOSIS — Z79899 Other long term (current) drug therapy: Secondary | ICD-10-CM | POA: Diagnosis not present

## 2015-01-30 DIAGNOSIS — Z7982 Long term (current) use of aspirin: Secondary | ICD-10-CM | POA: Insufficient documentation

## 2015-01-30 DIAGNOSIS — S0502XA Injury of conjunctiva and corneal abrasion without foreign body, left eye, initial encounter: Secondary | ICD-10-CM | POA: Insufficient documentation

## 2015-01-30 DIAGNOSIS — I10 Essential (primary) hypertension: Secondary | ICD-10-CM | POA: Insufficient documentation

## 2015-01-30 DIAGNOSIS — S0592XA Unspecified injury of left eye and orbit, initial encounter: Secondary | ICD-10-CM | POA: Diagnosis present

## 2015-01-30 MED ORDER — EYE WASH OPHTH SOLN
OPHTHALMIC | Status: AC
  Administered 2015-01-30: 18:00:00
  Filled 2015-01-30: qty 118

## 2015-01-30 MED ORDER — GENTAMICIN SULFATE 0.3 % OP SOLN: 1.0000 [drp] | Freq: Three times a day (TID) | OPHTHALMIC | Status: AC

## 2015-01-30 MED ORDER — FLUORESCEIN SODIUM 1 MG OP STRP
ORAL_STRIP | OPHTHALMIC | Status: AC
  Administered 2015-01-30: 1
  Filled 2015-01-30: qty 1

## 2015-01-30 MED ORDER — TETRACAINE HCL 0.5 % OP SOLN
OPHTHALMIC | Status: AC
  Administered 2015-01-30: 1 [drp]
  Filled 2015-01-30: qty 2

## 2015-01-30 NOTE — ED Notes (Signed)
Was pulling ceiling tile 3 days ago and got debrie in L eye, noted periorbital edema.

## 2015-01-30 NOTE — ED Provider Notes (Signed)
[facility] Emergency Department Provider Note  ____________________________________________  Time seen: Approximately 6:07 PM  I have reviewed the triage vital signs and the nursing notes.   HISTORY  Chief Complaint Facial Swelling    HPI Adrian Terry is a 44 y.o. male patient complaining of left eye pain secondary to foreign body sensation. Patient state instead occurred Friday while he was pulling sensation Tylenol got debris in his left eye. Patient state over the last 2 days it has been foreign body sensation and some mild eyelid edema. Patient denies any vision loss. No palliative measures taken for this complaint.   Past Medical History  Diagnosis Date  . MI (myocardial infarction) (HCC)   . Gastro-esophageal reflux   . Coronary artery disease     Small non-ST elevation myocardial infarction in December 2015. Cardiac catheterization showed no evidence of obstructive coronary artery disease. Significant coronary ectasia was noted especially in the right coronary artery with TIMI 2 flow. Ejection fraction was 55%.  Marland Kitchen. Hyperlipidemia   . Essential hypertension     Patient Active Problem List   Diagnosis Date Noted  . Coronary artery disease   . Hyperlipidemia   . Essential hypertension     Past Surgical History  Procedure Laterality Date  . Card[facility]      [facility]  . Cardiac catheterization  01/2014    Current Outpatient Rx  Name  Route  Sig  Dispense  Refill  . amLODipine (NORVASC) 5 MG tablet   Oral   Take 5 mg by mouth daily.         Marland Kitchen. aspirin 81 MG tablet   Oral   Take 81 mg by mouth daily.         Marland Kitchen. atorvastatin (LIPITOR) 80 MG tablet   Oral   Take 80 mg by mouth daily.         . clopidogrel (PLAVIX) 75 MG tablet   Oral   Take 1 tablet (75 mg total) by mouth daily.   30 tablet   6     To replace Brilinta   . gentamicin (GARAMYCIN) 0.3 % ophthalmic  solution   Left Eye   Place 1 drop into the left eye 3 (three) times daily.   5 mL   0   . metoprolol (LOPRESSOR) 50 MG tablet   Oral   Take 50 mg by mouth daily.          . nitroGLYCERIN (NITROSTAT) 0.4 MG SL tablet   Sublingual   Place 0.4 mg under the tongue every 5 (five) minutes as needed for chest pain.         Marland Kitchen. omeprazole (PRILOSEC) 20 MG capsule   Oral   Take 20 mg by mouth 2 (two) times daily before a meal.           Allergies Review of patient's allergies indicates no known allergies.  Family History  Problem Relation Age of Onset  . Heart disease Mother     Social History Social History  Substance Use Topics  . Smoking status: Never Smoker   . Smokeless tobacco: None  . Alcohol Use: Yes     Comment: socially    Review of Systems Constitutional: No fever/chills Eyes: No visual changes. Left eye pain and foreign body sensation. ENT: No sore throat. Cardiovascular: Denies chest pain. Respiratory: Denies shortness of breath. Gastrointestinal: No abdominal pain.  No nausea, no vomiting.  No diarrhea.  No  constipation. Genitourinary: Negative for dysuria. Musculoskeletal: Negative for back pain. Skin: Negative for rash. Neurological: Negative for headaches, focal weakness or numbness. Endocrine:Hypertension and hyperlipidemia. 10-point ROS otherwise negative.  ____________________________________________   PHYSICAL EXAM:  VITAL SIGNS: ED Triage Vitals  Enc Vitals Group     BP 01/30/15 1725 152/97 mmHg     Pulse Rate 01/30/15 1725 71     Resp 01/30/15 1725 18     Temp 01/30/15 1725 98.1 F (36.7 C)     Temp Source 01/30/15 1725 Oral     SpO2 01/30/15 1725 94 %     Weight 01/30/15 1725 225 lb (102.059 kg)     Height 01/30/15 1725  (1.803 m)     Head Cir --      Peak Flow --      Pain Score --      Pain Loc --      Pain Edu? --      Excl. in GC? --     Constitutional: Alert and oriented. Well appearing and in no acute  distress. Eyes: Conjunctivae are normal. PERRL. EOMI. fluoroscopy stain reveals cornea abrasion left eye. Lesion approximately 2-3 mL. No foreign body observed. Head: Atraumatic. Nose: No congestion/rhinnorhea. Mouth/Throat: Mucous membranes are moist.  Oropharynx non-erythematous. Neck: No stridor. No cervical spine tenderness to palpation. Hematological/Lymphatic/Immunilogical: No cervical lymphadenopathy. Cardiovascular: Normal rate, regular rhythm. Grossly normal heart sounds.  Good peripheral circulation. Respiratory: Normal respiratory effort.  No retractions. Lungs CTAB. Gastrointestinal: Soft and nontender. No distention. No abdominal bruits. No CVA tenderness. Musculoskeletal: No lower extremity tenderness nor edema.  No joint effusions. Neurologic:  Normal speech and language. No gross focal neurologic deficits are appreciated. No gait instability. Skin:  Skin is warm, dry and intact. No rash noted. Psychiatric: Mood and affect are normal. Speech and behavior are normal.  ____________________________________________   LABS (all labs ordered are listed, but only abnormal results are displayed)  Labs Reviewed - No data to display ____________________________________________  EKG   ____________________________________________  RADIOLOGY   ____________________________________________   PROCEDURES  Procedure(s) performed: None  Critical Care performed: No  ____________________________________________   INITIAL IMPRESSION / ASSESSMENT AND PLAN / ED COURSE  Pertinent labs & imaging results that were available during my care of the patient were reviewed by me and considered in my medical decision making (see chart for details).  Eye pain secondary to left cornea abrasion. She is given discharge care instructions. Patient given a prescription for gentamicin eyedrops. Patient advised follow-up with [facility] if there is no improvement or worsening of his  complaint. ____________________________________________   FINAL CLINICAL IMPRESSION(S) / ED DIAGNOSES  Final diagnoses:  Left cornea abrasion, initial encounter      Joni Reining, PA-C 01/30/15 1812  Sharman Cheek, MD 01/31/15 [phone]

## 2015-01-30 NOTE — ED Notes (Signed)
Pt reports getting piece of ceiling tile in left eye on Friday; reports swelling and irritation beginning on Saturday and worsening each day.

## 2015-01-30 NOTE — Discharge Instructions (Signed)
Corneal Abrasion °The cornea is the clear covering at the front and center of the eye. When you look at the colored portion of the eye, you are looking through the cornea. It is a thin tissue made up of layers. The top layer is the most sensitive layer. A corneal abrasion happens if this layer is scratched or an injury causes it to come off.  °HOME CARE °· You may be given drops or a medicated cream. Use the medicine as told by your doctor. °· A pressure patch may be put over the eye. If this is done, follow your doctor's instructions for when to remove the patch. Do not drive or use machines while the eye patch is on. Judging distances is hard to do with a patch on. °· See your doctor for a follow-up exam if you are told to do so. It is very important that you keep this appointment. °GET HELP IF:  °· You have pain, are sensitive to light, and have a scratchy feeling in one eye or both eyes. °· Your pressure patch keeps getting loose. You can blink your eye under the patch. °· You have fluid coming from your eye or the lids stick together in the morning. °· You have the same symptoms in the morning that you did with the first abrasion. This could be days, weeks, or months after the first abrasion healed. °  °This information is not intended to replace advice given to you by your health care provider. Make sure you discuss any questions you have with your health care provider. °  °Document Released: 07/31/2007 Document Revised: 11/02/2014 Document Reviewed: 10/19/2012 °Elsevier Interactive Patient Education ©2016 [employer] ° °

## 2015-03-20 ENCOUNTER — Encounter: Payer: Self-pay | Admitting: Cardiovascular Disease

## 2015-03-20 ENCOUNTER — Ambulatory Visit (INDEPENDENT_AMBULATORY_CARE_PROVIDER_SITE_OTHER): Payer: No Typology Code available for payment source | Admitting: Cardiovascular Disease

## 2015-03-20 VITALS — BP 134/100 | HR 67 | Ht 71.0 in | Wt 225.2 lb

## 2015-03-20 DIAGNOSIS — E785 Hyperlipidemia, unspecified: Secondary | ICD-10-CM

## 2015-03-20 DIAGNOSIS — I2510 Atherosclerotic heart disease of native coronary artery without angina pectoris: Secondary | ICD-10-CM | POA: Diagnosis not present

## 2015-03-20 DIAGNOSIS — I10 Essential (primary) hypertension: Secondary | ICD-10-CM | POA: Diagnosis not present

## 2015-03-20 NOTE — Assessment & Plan Note (Signed)
Continue high dose atorvastatin. I requested fasting lipid and liver profile. His LDL last year was not at target but he was not taking the medication regularly. He reports that he has not missed any doses recently. If LDL is above 70, I will consider adding Zetia.

## 2015-03-20 NOTE — Progress Notes (Signed)
Primary care physician: Dr. Letta Pate  HPI  This is a pleasant 45 year old African-American male who is here today for a follow-up visit regarding coronary artery disease.  He has known history of hypertension and hyperlipidemia. He was hospitalized in December 2015 for a small non-ST elevation myocardial infarction . Echocardiogram showed normal LV systolic function with no significant valvular abnormalities. Cardiac catheterization showed no evidence of obstructive coronary artery disease. There was significant coronary ectasia especially involving the right coronary artery with sluggish coronary flow. He he was treated medically. He is no longer on dual antiplatelet therapy. He has been doing well overall and denies any chest pain or shortness of breath. He has been taking his medications regularly but continues to take metoprolol only once daily.   No Known Allergies   Current Outpatient Prescriptions on File Prior to Visit  Medication Sig Dispense Refill  . amLODipine (NORVASC) 5 MG tablet Take 5 mg by mouth daily.    Marland Kitchen aspirin 81 MG tablet Take 81 mg by mouth daily.    Marland Kitchen atorvastatin (LIPITOR) 80 MG tablet Take 80 mg by mouth daily.    . metoprolol (LOPRESSOR) 50 MG tablet Take 50 mg by mouth daily.     . nitroGLYCERIN (NITROSTAT) 0.4 MG SL tablet Place 0.4 mg under the tongue every 5 (five) minutes as needed for chest pain.    Marland Kitchen omeprazole (PRILOSEC) 20 MG capsule Take 20 mg by mouth 2 (two) times daily before a meal.     No current facility-administered medications on file prior to visit.     Past Medical History  Diagnosis Date  . MI (myocardial infarction) (HCC)   . Gastro-esophageal reflux   . Coronary artery disease     Small non-ST elevation myocardial infarction in December 2015. Cardiac catheterization showed no evidence of obstructive coronary artery disease. Significant coronary ectasia was noted especially in the right coronary artery with TIMI 2 flow. Ejection  fraction was 55%.  Marland Kitchen Hyperlipidemia   . Essential hypertension      Past Surgical History  Procedure Laterality Date  . Cardiac catheterization      x1 stent; [facility]  . Cardiac catheterization  01/2014     Family History  Problem Relation Age of Onset  . Heart disease Mother      Social History   Social History  . Marital Status: Single    Spouse Name: N/A  . Number of Children: N/A  . Years of Education: N/A   Occupational History  . Not on file.   Social History Main Topics  . Smoking status: Never Smoker   . Smokeless tobacco: Not on file  . Alcohol Use: Yes     Comment: socially  . Drug Use: No  . Sexual Activity: Not on file   Other Topics Concern  . Not on file   Social History Narrative      PHYSICAL EXAM   BP 134/100 mmHg  Pulse 67  Ht  (1.803 m)  Wt 225 lb 4 oz (102.173 kg)  BMI 31.43 kg/m2 Constitutional: He is oriented to person, place, and time. He appears well-developed and well-nourished. No distress.  HENT: No nasal discharge.  Head: Normocephalic and atraumatic.  Eyes: Pupils are equal and round.  No discharge. Neck: Normal range of motion. Neck supple. No JVD present. No thyromegaly present.  Cardiovascular: Normal rate, regular rhythm, normal heart sounds. Exam reveals no gallop and no friction rub. No murmur heard.  Pulmonary/Chest:  Effort normal and breath sounds normal. No stridor. No respiratory distress. He has no wheezes. He has no rales. He exhibits no tenderness.  Abdominal: Soft. Bowel sounds are normal. He exhibits no distension. There is no tenderness. There is no rebound and no guarding.  Musculoskeletal: Normal range of motion. He exhibits no edema and no tenderness.  Neurological: He is alert and oriented to person, place, and time. Coordination normal.  Skin: Skin is warm and dry. No rash noted. He is not diaphoretic. No erythema. No pallor.  Psychiatric: He has a normal mood and  affect. His behavior is normal. Judgment and thought content normal.      EKG: Normal sinus rhythm with no significant ST or T wave changes.   ASSESSMENT AND PLAN

## 2015-03-20 NOTE — Assessment & Plan Note (Signed)
He is doing very well with no symptoms suggestive of angina. Continue aspirin 81 mg daily indefinitely. Continue treatment of risk factors. It has been more than one year since his small myocardial infarction and thus he does not require dual antiplatelet therapy.

## 2015-03-20 NOTE — Assessment & Plan Note (Signed)
Diastolic blood pressure is mildly elevated. I advised him to take metoprolol regularly. I might consider switching metoprolol to carvedilol in the future but he tends to forget to take the second dose of the medication.

## 2015-03-20 NOTE — Patient Instructions (Signed)
Medication Instructions:  Your physician recommends that you continue on your current medications as directed. Please refer to the Current Medication list given to you today.   Labwork: Fasting lipid and liver profile. Nothing to eat or drink after midnight the evening before your labs.   Testing/Procedures: none  Follow-Up: Your physician wants you to follow-up in: six months with Dr. Arida.  You will receive a reminder letter in the mail two months in advance. If you don't receive a letter, please call our office to schedule the follow-up appointment.   Any Other Special Instructions Will Be Listed Below (If Applicable).     If you need a refill on your cardiac medications before your next appointment, please call your pharmacy.   

## 2015-03-24 ENCOUNTER — Other Ambulatory Visit
Admission: RE | Admit: 2015-03-24 | Discharge: 2015-03-24 | Disposition: A | Discharge status: Home / Self Care | Payer: No Typology Code available for payment source | Source: Ambulatory Visit | Attending: Cardiovascular Disease | Admitting: Cardiovascular Disease

## 2015-03-24 ENCOUNTER — Other Ambulatory Visit: Payer: No Typology Code available for payment source

## 2015-03-24 DIAGNOSIS — I2510 Atherosclerotic heart disease of native coronary artery without angina pectoris: Secondary | ICD-10-CM | POA: Insufficient documentation

## 2015-03-24 LAB — LIPID PANEL
CHOL/HDL RATIO: 4.1 ratio
Cholesterol: 136 mg/dL (ref 0–200)
HDL: 33 mg/dL — ABNORMAL LOW (ref >40)
LDL Cholesterol: 89 mg/dL (ref 0–99)
Triglycerides: 72 mg/dL (ref <150)
VLDL: 14 mg/dL (ref 0–40)

## 2015-03-24 LAB — HEPATIC FUNCTION PANEL
ALT: 30 U/L (ref 17–63)
AST: 21 U/L (ref 15–41)
Albumin: 4.3 g/dL (ref 3.5–5.0)
Alkaline Phosphatase: 48 U/L (ref 38–126)
BILIRUBIN DIRECT: 0.1 mg/dL (ref 0.1–0.5)
BILIRUBIN TOTAL: 0.9 mg/dL (ref 0.3–1.2)
Indirect Bilirubin: 0.8 mg/dL (ref 0.3–0.9)
Total Protein: 7.7 g/dL (ref 6.5–8.1)

## 2015-08-05 IMAGING — CT CT NECK WITH CONTRAST
4 of 5 series · 15 of 33 positions shown, 17 images · IV contrast (omnipaque)
Comparison: Portable chest radiograph and plane neck x-rays today
at 750 hours.

ADDENDUM:
This study was discussed by telephone with Dr. KAUDINGE SABATHA in
the [HOSPITAL] the time of interpretation (3893 hours
on 05/21/2014).
CLINICAL DATA: 43-year-old male who awoke this morning with acute
throat tightening sensation. Trouble breathing and swallowing.
Initial encounter.

EXAM:
CT NECK WITH CONTRAST
TECHNIQUE: Multidetector CT imaging of the neck was performed using the
standard protocol following the bolus administration of intravenous
contrast.
CONTRAST:  75 mL Omnipaque 300.

[Series 2: axial neck · axial · 0.51mm/px · z∈[+218,+362]mm · 4 of 121 slices shown, 5 images]
[im 25/121  soft-tissue]
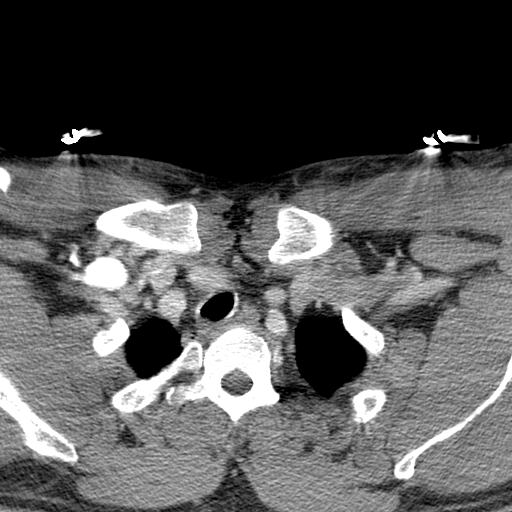
[im 25/121  bone]
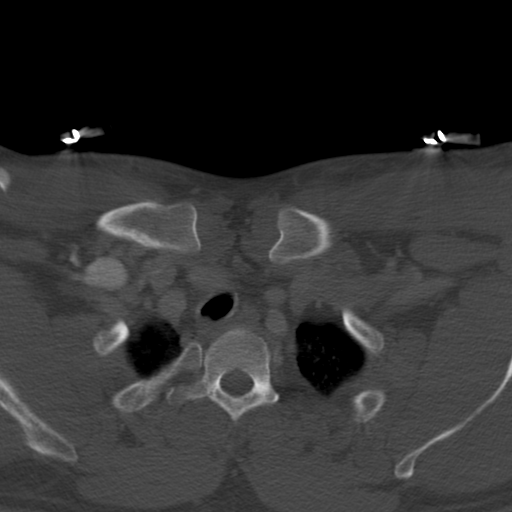
[im 49/121  bone]
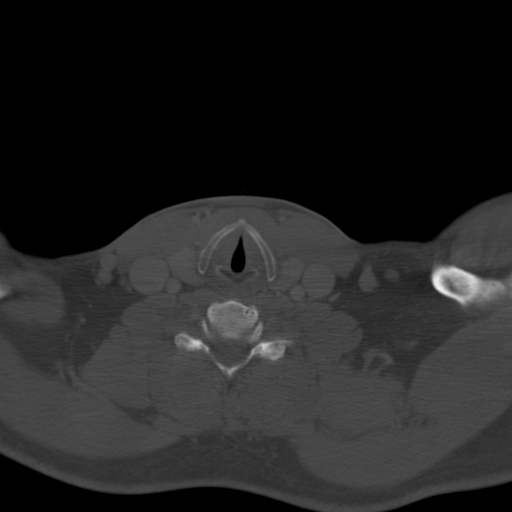
[im 73/121  bone]
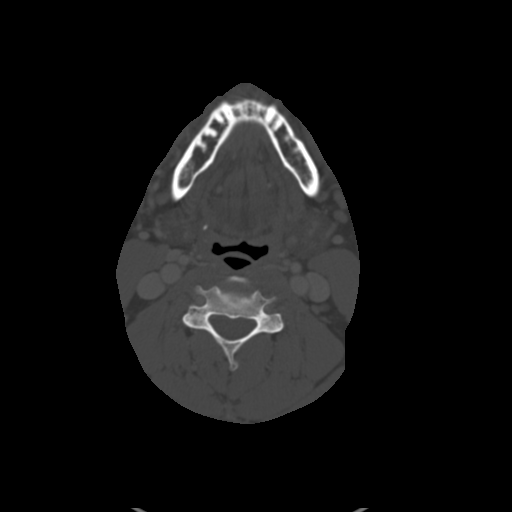
[im 97/121  bone]
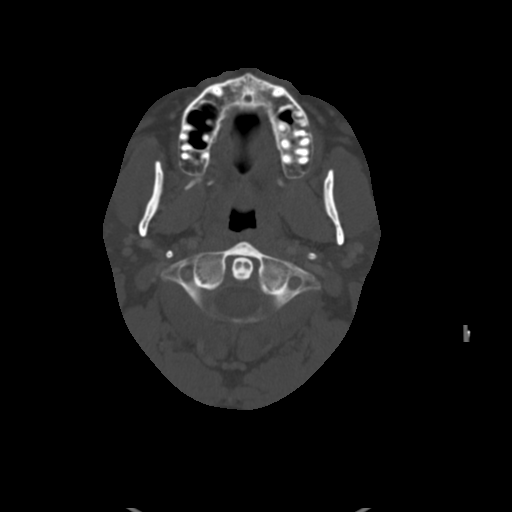

[Series 5: sag neck · sagittal · 0.43mm/px · 5 of 99 slices shown, 6 images]
[im 33/99  bone]
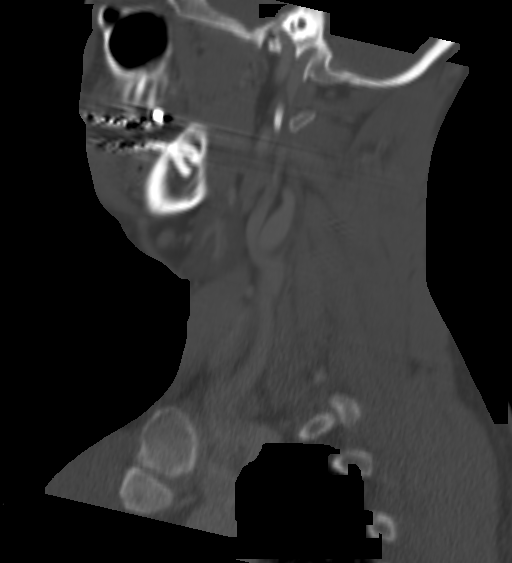
[im 41/99  bone]
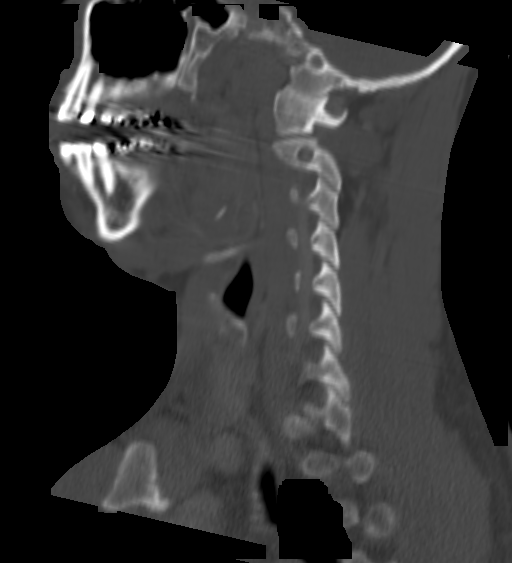
[im 50/99  soft-tissue]
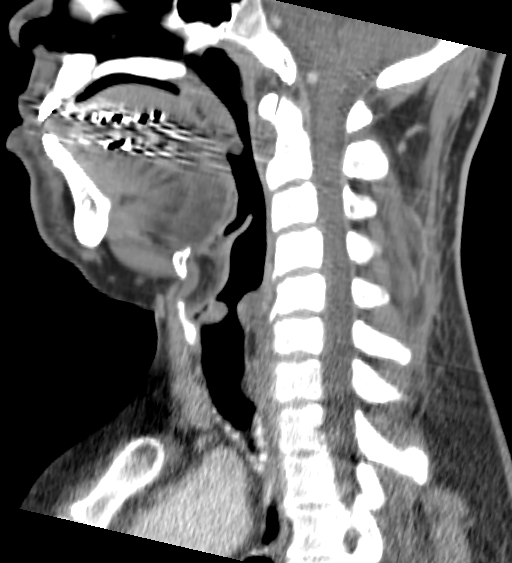
[im 50/99  bone]
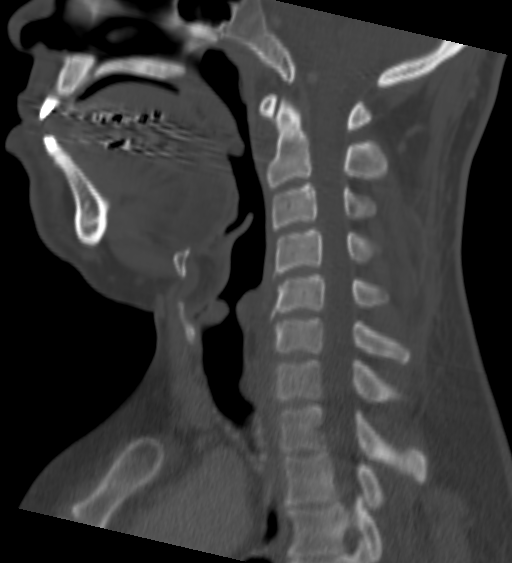
[im 58/99  bone]
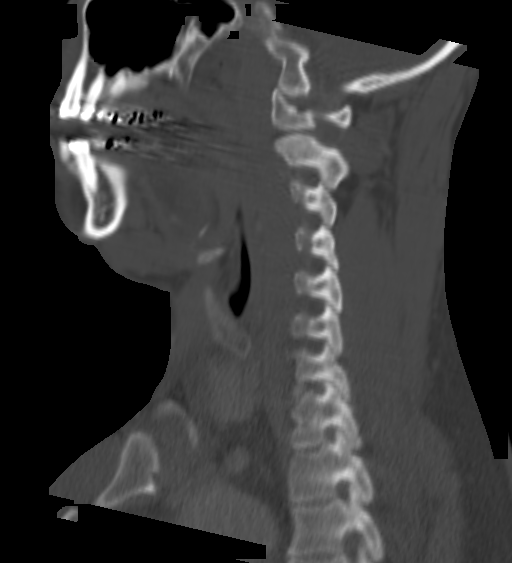
[im 66/99  bone]
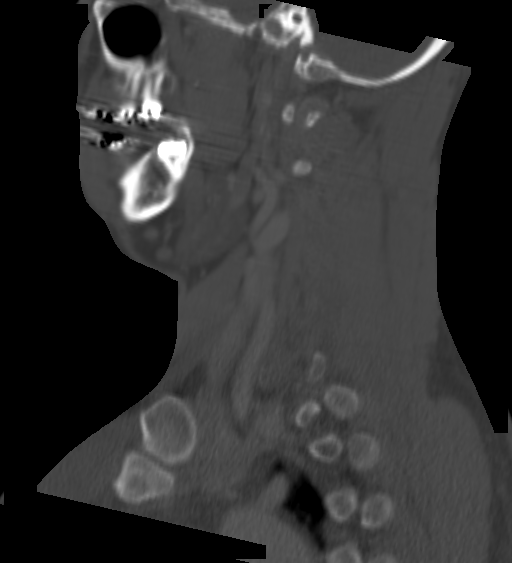

[Series 6: cor neck · coronal · 0.50mm/px · 3 of 101 slices shown]
[im 21/101  bone]
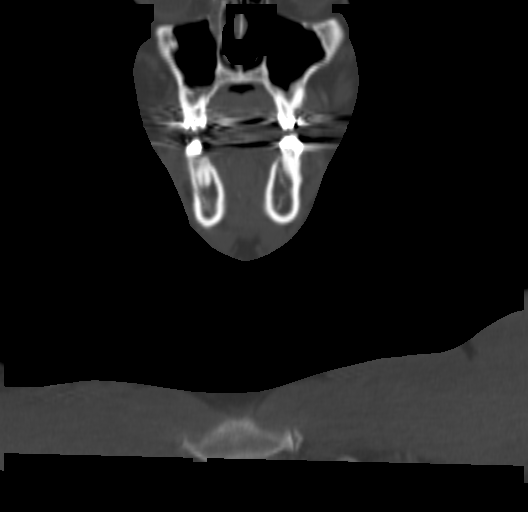
[im 41/101  bone]
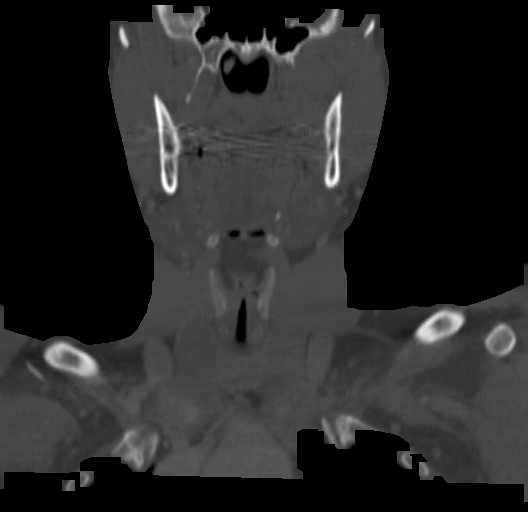
[im 61/101  bone]
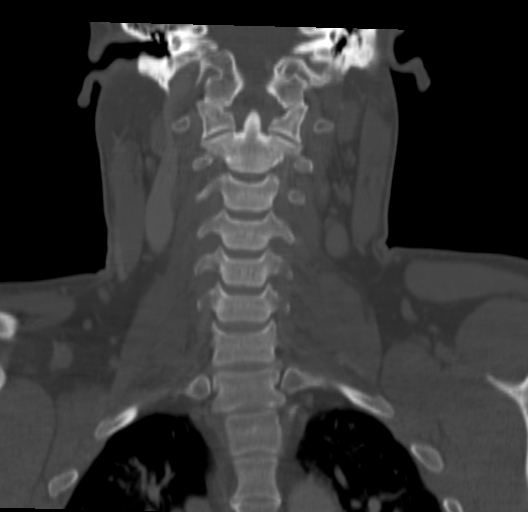

[Series 7: ax oropharynx · axial · 0.45mm/px · z∈[+188,+283]mm · 3 of 124 slices shown]
[im 25/124  bone]
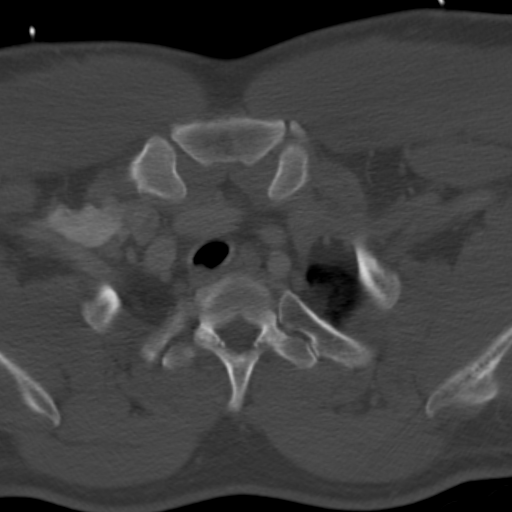
[im 50/124  bone]
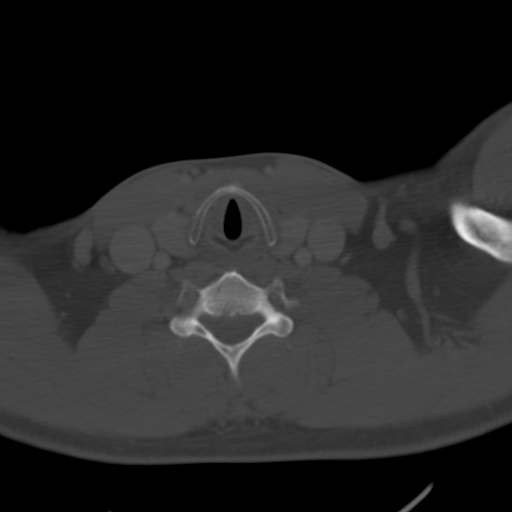
[im 74/124  bone]
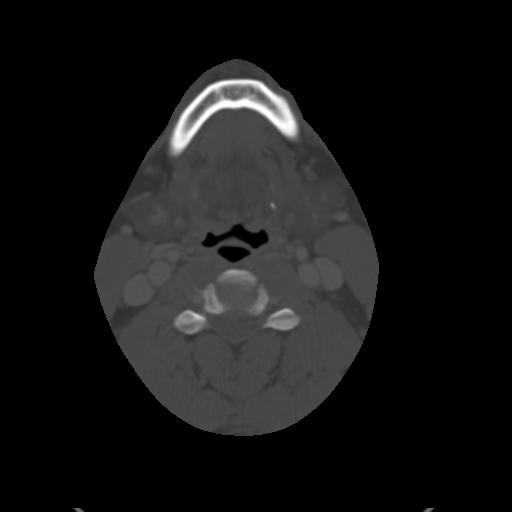

[15 of 33 positions shown; findings below may reference images not displayed]

FINDINGS: Pharynx and larynx: In the midline of the supraglottic larynx there
is a polypoid 10 mm diameter soft tissue mass, which seems to arise
from the anterior commissure, but also is inseparable from the
undersurface of the base of the epiglottis. See series 2, image 68
and sagittal image 51. The epiglottis itself an the aryepiglottic
folds are within normal limits. The vocal folds are within normal
limits. The supraglottic narrow way is mildly to moderately narrowed
(sagittal image 51).

Mild to moderate palatine tonsil and adenoid hypertrophy appear
symmetric. Negative parapharyngeal, retropharyngeal and sublingual
spaces.

Salivary glands: Fatty infiltrated but otherwise negative
submandibular and parotid glands.

Thyroid: Negative.

Lymph nodes: Left level 3 lymph nodes demonstrate mild asymmetric
enlargement, but remain within normal limits by size and morphology
criteria, measuring up to 7 mm short axis. The largest lymph nodes
are at the bilateral level IIa stations and measure 10-11 mm short
axis bilaterally. No level 1, level 4, or level 5 lymphadenopathy.
Small retropharyngeal nodes are within normal limits.

Vascular: Normal aside from tortuosity of the proximal great
vessels, and ectasia of the visible aortic arch measuring 37-40 mm
diameter.

Limited intracranial: Negative.

Visualized orbits: Negative.

Mastoids and visualized paranasal sinuses:   are clear.

Skeleton:  No acute osseous abnormality identified.

Upper chest: Upper lung atelectasis. No superior mediastinal
lymphadenopathy. A degree of aortic arch ectasia is Re identified.
Negative visible axillary lymph nodes.
IMPRESSION: 1. Polypoid 10 mm mass of the supraglottic larynx appears to arise
either from the anterior commissure or the undersurface of the
epiglottis and occupies the central aspect of the supraglottic
airway on these images resulting in mild to moderate airway
stenosis. This could be a benign or malignant neoplasm, but benign
etiology is favored in light of its circumscribed appearance and no
malignant lymphadenopathy identified.
2. Upper lung atelectasis.
3. Ectatic to mildly aneurysmal thoracic aortic arch. Recommend
annual imaging followup by CTA or MRA. This recommendation follows
5414 ACCF/AHA/AATS/ACR/ASA/SCA/GIFT MAZWI/HARDONO/GEISHA/ZEMARYAL Guidelines for the
Diagnosis and Management of Patients With Thoracic Aortic Disease.
Circulation. 5414; 121: [id number]

## 2015-10-30 ENCOUNTER — Emergency Department
Admission: EM | Admit: 2015-10-30 | Discharge: 2015-10-30 | Disposition: A | Discharge status: Home / Self Care | Payer: No Typology Code available for payment source | Attending: Emergency Medicine | Admitting: Emergency Medicine

## 2015-10-30 DIAGNOSIS — I10 Essential (primary) hypertension: Secondary | ICD-10-CM | POA: Insufficient documentation

## 2015-10-30 DIAGNOSIS — I252 Old myocardial infarction: Secondary | ICD-10-CM | POA: Diagnosis not present

## 2015-10-30 DIAGNOSIS — R112 Nausea with vomiting, unspecified: Secondary | ICD-10-CM | POA: Diagnosis present

## 2015-10-30 DIAGNOSIS — Z79899 Other long term (current) drug therapy: Secondary | ICD-10-CM | POA: Insufficient documentation

## 2015-10-30 DIAGNOSIS — I2510 Atherosclerotic heart disease of native coronary artery without angina pectoris: Secondary | ICD-10-CM | POA: Diagnosis not present

## 2015-10-30 DIAGNOSIS — H8110 Benign paroxysmal vertigo, unspecified ear: Secondary | ICD-10-CM | POA: Diagnosis not present

## 2015-10-30 LAB — CBC
HEMATOCRIT: 44.7 % (ref 40.0–52.0)
Hemoglobin: 15.4 g/dL (ref 13.0–18.0)
MCH: 30.1 pg (ref 26.0–34.0)
MCHC: 34.4 g/dL (ref 32.0–36.0)
MCV: 87.7 fL (ref 80.0–100.0)
Platelets: 234 10*3/uL (ref 150–440)
RBC: 5.1 MIL/uL (ref 4.40–5.90)
RDW: 13.7 % (ref 11.5–14.5)
WBC: 5.5 10*3/uL (ref 3.8–10.6)

## 2015-10-30 LAB — BASIC METABOLIC PANEL
Anion gap: 6 (ref 5–15)
BUN: 12 mg/dL (ref 6–20)
CALCIUM: 9.2 mg/dL (ref 8.9–10.3)
CO2: 27 mmol/L (ref 22–32)
CREATININE: 1.06 mg/dL (ref 0.61–1.24)
Chloride: 105 mmol/L (ref 101–111)
GFR calc Af Amer: >60 mL/min (ref >60)
GLUCOSE: 125 mg/dL — AB (ref 65–99)
Potassium: 3.8 mmol/L (ref 3.5–5.1)
Sodium: 138 mmol/L (ref 135–145)

## 2015-10-30 LAB — TROPONIN I: TROPONIN I: 0.04 ng/mL — AB (ref <0.03)

## 2015-10-30 LAB — GLUCOSE, CAPILLARY: GLUCOSE-CAPILLARY: 126 mg/dL — AB (ref 65–99)

## 2015-10-30 MED ORDER — MECLIZINE HCL 25 MG PO TABS: 25.0000 mg | ORAL_TABLET | Freq: Three times a day (TID) | ORAL | 0 refills | Status: AC | PRN

## 2015-10-30 NOTE — ED Triage Notes (Signed)
Pt comes in to ED w/ c/o dizziness accompanied by nausea and vomiting that started this morning. Emesis occurrence x1. Denies Syncope. Pt AOx4. NAD noted in triage.

## 2015-10-30 NOTE — ED Provider Notes (Signed)
[facility] Emergency Department Provider Note   ____________________________________________   I have reviewed the triage vital signs and the nursing notes.   HISTORY  Chief Complaint Dizziness; Nausea; and Emesis   History limited by: Not Limited   HPI Adrian Terry is a 45 y.o. male who presents to the emergency department today because of concerns for dizziness. It started this morning. It has been going on throughout the day. It does occur when the patient stands up or changes his head position while lying in bed. It will then go away. Patient has had some associated nausea with this. Patient denies similar symptoms in the past. He denies any headache. No chest pain. No shortness breath. No recent viral illnesses. He did think he was dehydrated this morning so drink fluids.   Past Medical History:  Diagnosis Date  . Coronary artery disease    Small non-ST elevation myocardial infarction in December 2015. Cardiac catheterization showed no evidence of obstructive coronary artery disease. Significant coronary ectasia was noted especially in the right coronary artery with TIMI 2 flow. Ejection fraction was 55%.  . Essential hypertension   . Gastro-esophageal reflux   . Hyperlipidemia   . MI (myocardial infarction) [facility]HCC)     Patient Active Problem List   Diagnosis Date Noted  . Coronary artery disease   . Hyperlipidemia   . Essential hypertension     Past Surgical History:  Procedure Laterality Date  . CARDIAC CATHETERIZATION     [facility]  . CARDIAC CATHETERIZATION  01/2014    Prior to Admission medications   Medication Sig Start Date End Date Taking? Authorizing Provider  amLODipine (NORVASC) 5 MG tablet Take 5 mg by mouth daily.    Historical Provider, MD  aspirin 81 MG tablet Take 81 mg by mouth daily.    Historical Provider, MD  atorvastatin (LIPITOR) 80 MG tablet Take 80 mg by mouth daily.     Historical Provider, MD  metoprolol (LOPRESSOR) 50 MG tablet Take 50 mg by mouth daily.     Historical Provider, MD  nitroGLYCERIN (NITROSTAT) 0.4 MG SL tablet Place 0.4 mg under the tongue every 5 (five) minutes as needed for chest pain.    Historical Provider, MD  omeprazole (PRILOSEC) 20 MG capsule Take 20 mg by mouth 2 (two) times daily before a meal.    Historical Provider, MD    Allergies Review of patient's allergies indicates no known allergies.  Family History  Problem Relation Age of Onset  . Heart disease Mother     Social History Social History  Substance Use Topics  . Smoking status: Never Smoker  . Smokeless tobacco: Never Used  . Alcohol use Yes     Comment: socially    Review of Systems  Constitutional: Negative for fever. Cardiovascular: Negative for chest pain. Respiratory: Negative for shortness of breath. Gastrointestinal: Negative for abdominal pain, vomiting and diarrhea. Neurological: Negative for headaches, focal weakness or numbness.  10-point ROS otherwise negative.  ____________________________________________   PHYSICAL EXAM:  VITAL SIGNS: ED Triage Vitals  Enc Vitals Group     BP 10/30/15 1401 (!) 157/103     Pulse Rate 10/30/15 1401 63     Resp 10/30/15 1401 16     Temp 10/30/15 1401 97.7 F (36.5 C)     Temp Source 10/30/15 1401 Oral     SpO2 10/30/15 1401 97 %     Weight 10/30/15 1402 225 lb (102.1 kg)  Height 10/30/15 1402 5\' 11"  (1.803 m)     Head Circumference --    Constitutional: Alert and oriented. Well appearing and in no distress. Eyes: Conjunctivae are normal. Normal extraocular movements. ENT   Head: Normocephalic and atraumatic.   Nose: No congestion/rhinnorhea.   Mouth/Throat: Mucous membranes are moist.   Neck: No stridor. Hematological/Lymphatic/Immunilogical: No cervical lymphadenopathy. Cardiovascular: Normal rate, regular rhythm.  No murmurs, rubs, or gallops. Respiratory: Normal respiratory  effort without tachypnea nor retractions. Breath sounds are clear and equal bilaterally. No wheezes/rales/rhonchi. Gastrointestinal: Soft and nontender. No distention.  Genitourinary: Deferred Musculoskeletal: Normal range of motion in all extremities. No lower extremity edema. Neurologic:  Normal speech and language. No gross focal neurologic deficits are appreciated. Dizziness elicited with Gilberto Better.  Skin:  Skin is warm, dry and intact. No rash noted. Psychiatric: Mood and affect are normal. Speech and behavior are normal. Patient exhibits appropriate insight and judgment.  ____________________________________________    LABS (pertinent positives/negatives)  Labs Reviewed  BASIC METABOLIC PANEL - Abnormal; Notable for the following:       Result Value   Glucose, Bld 125 (*)    All other components within normal limits  TROPONIN I - Abnormal; Notable for the following:    Troponin I 0.04 (*)    All other components within normal limits  GLUCOSE, CAPILLARY - Abnormal; Notable for the following:    Glucose-Capillary 126 (*)    All other components within normal limits  CBC  URINALYSIS COMPLETEWITH MICROSCOPIC ([facility] ONLY)  CBG MONITORING, ED     ____________________________________________   EKG  I, Phineas Semen, attending physician, personally viewed and interpreted this EKG  EKG Time: 1406 Rate: 59 Rhythm: sinus bradycardia Axis: normal Intervals: qtc 409 QRS: narrow ST changes: no st elevation Impression: abnormal ekg ____________________________________________    RADIOLOGY  None  ____________________________________________   PROCEDURES  Procedures  ____________________________________________   INITIAL IMPRESSION / ASSESSMENT AND PLAN / ED COURSE  Pertinent labs & imaging results that were available during my care of the patient were reviewed by me and considered in my medical decision making (see chart for details).  Patient presented  to the emergency department today because of concerns for dizziness that started this morning got worse with movement of his head. On exam patient did have strongly positive Dix-Hallpike. This point I think BPPV likely. The patient did have blood work drawn before my examination which did show mildly elevated troponin. I discussed this with the patient. He denied any chest pain. He does have a history of elevated troponins. To have patient stay in the emergency department to retest however she did not feel that was necessary. He will follow up with cardiology.   ____________________________________________   FINAL CLINICAL IMPRESSION(S) / ED DIAGNOSES  Final diagnoses:  BPPV (benign paroxysmal positional vertigo), unspecified laterality     Note: This dictation was prepared with Dragon dictation. Any transcriptional errors that result from this process are unintentional    Phineas Semen, MD 10/30/15 1535

## 2015-10-30 NOTE — Discharge Instructions (Signed)
Please seek medical attention for any high fevers, chest pain, shortness of breath, change in behavior, persistent vomiting, bloody stool or any other new or concerning symptoms.  

## 2015-10-30 NOTE — ED Notes (Signed)
Pt reports dizziness since awakening this am  Nausea with one episode of vomiting  Pt denies pain at this time

## 2015-11-08 NOTE — Progress Notes (Signed)
Cardiology Office Note Date:  11/09/2015  Patient ID:  Adrian, Terry [birth year], MRN [id number] PCP:  Emogene Morgan, MD  Cardiologist:  Dr. Kirke Corin, MD    Chief Complaint: ED follow up  History of Present Illness: Adrian Terry is a 45 y.o. male with history of CAD s/p NSTEMI in 01/2014 with cardiac cath showing no evidence of obstructive CAD at that time, HTN, and HLD who presents for ED follow up for dizziness felt to be BPPV.   He was last seen by Dr. Kirke Corin, MD on 03/20/2015 for routine follow up and was doing well at that time. He was admitted to the hospital in 01/2014 as above for a small NSTEMI. Cardiac cath at that time showed no evidence of obstructive CAD. There was significant coronary ectasia especially involving the RCA with slugglish coronary flow. He was treated medically. Echo at that time showed nomral LV systolic function with no significant vavular abnormalities. He is no longer on DAPT. He was recently seen in the ED on 10/30/15 for nausea, vomiting, and dizziness with positional changes. He was noted to have a strongly positive Dix-Hallpike on exam in the ED and was treated for BPPV. Troponin was checked x 1 in the ED and found to be mildly elevated at 0.04 (history of troponinemia). CBC was unremarkable. SCr 1.06, K+ 3.8. He was diacharged with outpatient follow up.  He most recently called EMS out to his house on 11/08/15 for nonexertional chest pain that lasted for a couple hours without associated symptoms. Upon their arrival a 12-lead EKG was done that showed NSR, 73 bpm, TWI in lead III. He did not go to the ED. Patient reports he had stopped taking all of his medication approximately 10 days prior. He does not check his BP at home. He also has severe acid reflux when he does not take his omeprazole, which he had not taken for 10 days. His chest pain felt similar to his reflux, though not 100%. He continues to use vape. He denies any illegal drugs of abuse. He is  getting married on 11/11/15 and has been under a lot of stress. Secondly, he reports possible panic attacks/increased anxiety. He reports not being able to let things go. When he gets worked up he will notice palpitations that last until he calms down.    Past Medical History:  Diagnosis Date  . Coronary artery disease    Small non-ST elevation myocardial infarction in December 2015. Cardiac catheterization showed no evidence of obstructive coronary artery disease. Significant coronary ectasia was noted especially in the right coronary artery with TIMI 2 flow. Ejection fraction was 55%.  . Essential hypertension   . Gastro-esophageal reflux   . Hyperlipidemia   . MI (myocardial infarction) [facility])     Past Surgical History:  Procedure Laterality Date  . CARDIAC CATHETERIZATION     x1 stent; [facility]  . CARDIAC CATHETERIZATION  01/2014    Current Outpatient Prescriptions  Medication Sig Dispense Refill  . amLODipine (NORVASC) 5 MG tablet Take 5 mg by mouth daily.    Marland Kitchen aspirin 81 MG tablet Take 81 mg by mouth daily.    Marland Kitchen atorvastatin (LIPITOR) 80 MG tablet Take 80 mg by mouth daily.    . meclizine (ANTIVERT) 25 MG tablet Take 1 tablet (25 mg total) by mouth 3 (three) times daily as needed for dizziness. 30 tablet 0  . metoprolol (LOPRESSOR) 50 MG tablet Take 50 mg by mouth  daily.     . nitroGLYCERIN (NITROSTAT) 0.4 MG SL tablet Place 0.4 mg under the tongue every 5 (five) minutes as needed for chest pain.    Marland Kitchen omeprazole (PRILOSEC) 20 MG capsule Take 20 mg by mouth 2 (two) times daily before a meal.     No current facility-administered medications for this visit.     Allergies:   Review of patient's allergies indicates no known allergies.   Social History:  The patient  reports that he has never smoked. He has never used smokeless tobacco. He reports that he drinks alcohol. He reports that he uses drugs, including Marijuana.   Family History:  The  patient's family history includes Heart disease in his mother.  ROS:   Review of Systems  Constitutional: Negative for chills, diaphoresis, fever, malaise/fatigue and weight loss.  HENT: Negative for congestion.   Eyes: Negative for discharge and redness.  Respiratory: Negative for cough, sputum production, shortness of breath and wheezing.   Cardiovascular: Positive for chest pain and palpitations. Negative for orthopnea, claudication, leg swelling and PND.  Gastrointestinal: Negative for abdominal pain, heartburn, nausea and vomiting.  Musculoskeletal: Negative for falls and myalgias.  Skin: Negative for rash.  Neurological: Negative for dizziness, tingling, tremors, sensory change, speech change, focal weakness, loss of consciousness and weakness.  Endo/Heme/Allergies: Does not bruise/bleed easily.  Psychiatric/Behavioral: Negative for substance abuse. The patient is nervous/anxious.   All other systems reviewed and are negative.    PHYSICAL EXAM:  VS:  BP (!) 142/100   Pulse (!) 56   Ht 5\' 11"  (1.803 m)   Wt 216 lb 6.4 oz (98.2 kg)   BMI 30.18 kg/m  BMI: Body mass index is 30.18 kg/m.  Physical Exam  Constitutional: He is oriented to person, place, and time. He appears well-developed and well-nourished.  HENT:  Head: Normocephalic and atraumatic.  Eyes: Right eye exhibits no discharge. Left eye exhibits no discharge.  Neck: Normal range of motion. No JVD present.  Cardiovascular: Normal rate, regular rhythm, S1 normal, S2 normal and normal heart sounds.  Exam reveals no distant heart sounds, no friction rub, no midsystolic click and no opening snap.   No murmur heard. Pulmonary/Chest: Effort normal and breath sounds normal. No respiratory distress. He has no decreased breath sounds. He has no wheezes. He has no rales. He exhibits no tenderness.  Abdominal: Soft. He exhibits no distension. There is no tenderness.  Musculoskeletal: He exhibits no edema.  Neurological: He is  alert and oriented to person, place, and time.  Skin: Skin is warm and dry. No cyanosis. Nails show no clubbing.  Numerous tattoos  Psychiatric: He has a normal mood and affect. His speech is normal and behavior is normal. Judgment and thought content normal.     EKG:  Was ordered and interpreted by me today. Shows sinus bradycardia, 56 bpm, no acute st/t changes   Recent Labs: 03/24/2015: ALT 30 10/30/2015: BUN 12; Creatinine, Ser 1.06; Hemoglobin 15.4; Platelets 234; Potassium 3.8; Sodium 138  03/24/2015: Cholesterol 136; HDL 33; LDL Cholesterol 89; Total CHOL/HDL Ratio 4.1; Triglycerides 72; VLDL 14   Estimated Creatinine Clearance: 106.3 mL/min (by C-G formula based on SCr of 1.06 mg/dL).   Wt Readings from Last 3 Encounters:  11/09/15 216 lb 6.4 oz (98.2 kg)  10/30/15 225 lb (102.1 kg)  03/20/15 225 lb 4 oz (102.2 kg)     Other studies reviewed: Additional studies/records reviewed today include: summarized above  ASSESSMENT AND PLAN:  1. CAD as  above: Continue aggressive medical management with ASA 81 mg daily, Lipitor 80 mg daily, and Lopressor 50 mg daily (rather than bid). SL NTG PRN. Schedule nuclear stress test as below (treadmill Myvoiew).   2. History of elevated troponin/chest pain: History of troponinemia with chronic mildly elevated troponin upon review prior labs. Troponin was found to be 0.04 in the ED. Never with chest pain during ED visit in which troponin was checked. Schedule nuclear stress test to evaluate for high-risk ischemia. Currently chest pain free.   3. HTN: Increase amlodipine to 10 mg daily. Continue Lopressor 50 mg daily (not bid).  4. Palpitation/anxiety: Offered event monitor, he declined stating this occurs infrequently. If symptoms return or increase would pursue cardiac monitoring. Follow up with PCP for anxiety.   5. HLD: LDL of 89 on 03/24/15. Continue Lipitor 80 mg daily. Check lipid and liver today.   Disposition: F/u with Dr. Kirke CorinArida, MD in 1  month.   Current medicines are reviewed at length with the patient today.  The patient did not have any concerns regarding medicines.  Elinor DodgeSigned, Marilee Ditommaso PA-C 11/09/2015 10:37 AM     [facility] [place] [address] [place], [place] [address] [phone]

## 2015-11-09 ENCOUNTER — Encounter: Payer: Self-pay | Admitting: Physician Assistant

## 2015-11-09 ENCOUNTER — Encounter (INDEPENDENT_AMBULATORY_CARE_PROVIDER_SITE_OTHER): Payer: Self-pay

## 2015-11-09 ENCOUNTER — Ambulatory Visit (INDEPENDENT_AMBULATORY_CARE_PROVIDER_SITE_OTHER): Payer: No Typology Code available for payment source | Admitting: Physician Assistant

## 2015-11-09 VITALS — BP 142/100 | HR 56 | Ht 71.0 in | Wt 216.4 lb

## 2015-11-09 DIAGNOSIS — R002 Palpitations: Secondary | ICD-10-CM | POA: Diagnosis not present

## 2015-11-09 DIAGNOSIS — I2510 Atherosclerotic heart disease of native coronary artery without angina pectoris: Secondary | ICD-10-CM

## 2015-11-09 DIAGNOSIS — F419 Anxiety disorder, unspecified: Secondary | ICD-10-CM

## 2015-11-09 DIAGNOSIS — E785 Hyperlipidemia, unspecified: Secondary | ICD-10-CM | POA: Diagnosis not present

## 2015-11-09 DIAGNOSIS — I10 Essential (primary) hypertension: Secondary | ICD-10-CM

## 2015-11-09 MED ORDER — AMLODIPINE BESYLATE 10 MG PO TABS: 10.0000 mg | ORAL_TABLET | Freq: Every day | ORAL | 5 refills | Status: AC

## 2015-11-09 NOTE — Patient Instructions (Addendum)
Medication Instructions:  Your physician has recommended you make the following change in your medication:  1. Increase Amlodipine to 10 mg Daily  Lab: Lipid and Cmet today    Testing/Procedures: [facility] MYOVIEW  Your caregiver has ordered a Stress Test with nuclear imaging. The purpose of this test is to evaluate the blood supply to your heart muscle. This procedure is referred to as a "Non-Invasive Stress Test." This is because other than having an IV started in your vein, nothing is inserted or "invades" your body. Cardiac stress tests are done to find areas of poor blood flow to the heart by determining the extent of coronary artery disease (CAD). Some patients exercise on a treadmill, which naturally increases the blood flow to your heart, while others who are  unable to walk on a treadmill due to physical limitations have a pharmacologic/chemical stress agent called Lexiscan . This medicine will mimic walking on a treadmill by temporarily increasing your coronary blood flow.   Please note: these test may take anywhere between 2-4 hours to complete  PLEASE REPORT TO [facility]  THE VOLUNTEERS AT THE FIRST DESK WILL DIRECT YOU WHERE TO GO  Date of Procedure:__Friday November 10, 2015 at 09:00AM___  Arrival Time for Procedure:__Arrive at 08:45AM to register____  Instructions regarding medication:   __X__:  Hold Metoprolol the night before procedure and morning of procedure.    PLEASE NOTIFY THE OFFICE AT LEAST 24 HOURS IN ADVANCE IF YOU ARE UNABLE TO KEEP YOUR APPOINTMENT.  [phone] AND  PLEASE NOTIFY NUCLEAR MEDICINE AT [facility] AT LEAST 24 HOURS IN ADVANCE IF YOU ARE UNABLE TO KEEP YOUR APPOINTMENT. [phone]  How to prepare for your Myoview test:  1. Do not eat or drink after midnight 2. No caffeine for 24 hours prior to test 3. No smoking 24 hours prior to test. 4. Your medication may be taken with water.  If your doctor stopped a medication because of  this test, do not take that medication. 5. Ladies, please do not wear dresses.  Skirts or pants are appropriate. Please wear a short sleeve shirt. 6. No perfume, cologne or lotion. 7. Wear comfortable walking shoes. No heels!   Follow-Up: Your physician recommends that you schedule a follow-up appointment in: 4 weeks with Eula Listenyan Dunn PA    Any Other Special Instructions Will Be Listed Below (If Applicable).     If you need a refill on your cardiac medications before your next appointment, please call your pharmacy.

## 2015-11-10 ENCOUNTER — Encounter: Payer: No Typology Code available for payment source | Attending: Physician Assistant

## 2015-11-10 ENCOUNTER — Telehealth: Payer: Self-pay | Admitting: Physician Assistant

## 2015-11-10 LAB — COMPREHENSIVE METABOLIC PANEL
ALK PHOS: 60 IU/L (ref 39–117)
ALT: 24 IU/L (ref 0–44)
AST: 17 IU/L (ref 0–40)
Albumin/Globulin Ratio: 1.5 (ref 1.2–2.2)
Albumin: 4.5 g/dL (ref 3.5–5.5)
BUN/Creatinine Ratio: 12 (ref 9–20)
BUN: 12 mg/dL (ref 6–24)
Bilirubin Total: 0.6 mg/dL (ref 0.0–1.2)
CO2: 25 mmol/L (ref 18–29)
CREATININE: 1.03 mg/dL (ref 0.76–1.27)
Calcium: 9.7 mg/dL (ref 8.7–10.2)
Chloride: 101 mmol/L (ref 96–106)
GFR calc Af Amer: 102 mL/min/{1.73_m2} (ref >59)
GFR calc non Af Amer: 88 mL/min/{1.73_m2} (ref >59)
GLOBULIN, TOTAL: 3 g/dL (ref 1.5–4.5)
GLUCOSE: 100 mg/dL — AB (ref 65–99)
Potassium: 4.2 mmol/L (ref 3.5–5.2)
SODIUM: 141 mmol/L (ref 134–144)
Total Protein: 7.5 g/dL (ref 6.0–8.5)

## 2015-11-10 LAB — LIPID PANEL
CHOLESTEROL TOTAL: 134 mg/dL (ref 100–199)
Chol/HDL Ratio: 3.5 ratio units (ref 0.0–5.0)
HDL: 38 mg/dL — ABNORMAL LOW (ref >39)
LDL CALC: 77 mg/dL (ref 0–99)
Triglycerides: 96 mg/dL (ref 0–149)
VLDL CHOLESTEROL CAL: 19 mg/dL (ref 5–40)

## 2015-11-10 NOTE — Telephone Encounter (Signed)
Called pt to review labs. States he missed his 9am myoview @ [facility] that was scheduled at 9/14 (yesterday's) OV. He is aware and reports he "tried to tell them I didn't want it today" and would like to wait and reschedule after his honeymoon as he is getting married tomorrow.  He will call back in a few weeks.

## 2015-12-08 ENCOUNTER — Ambulatory Visit: Payer: No Typology Code available for payment source | Admitting: Cardiovascular Disease

## 2015-12-08 ENCOUNTER — Encounter: Payer: Self-pay | Admitting: *Deleted

## 2016-11-06 ENCOUNTER — Encounter: Payer: Self-pay | Admitting: Emergency Medicine

## 2016-11-06 ENCOUNTER — Emergency Department
Admission: EM | Admit: 2016-11-06 | Discharge: 2016-11-06 | Disposition: A | Discharge status: Home / Self Care | Payer: Medicaid Other | Attending: Emergency Medicine | Admitting: Emergency Medicine

## 2016-11-06 DIAGNOSIS — Z79899 Other long term (current) drug therapy: Secondary | ICD-10-CM | POA: Insufficient documentation

## 2016-11-06 DIAGNOSIS — L249 Irritant contact dermatitis, unspecified cause: Secondary | ICD-10-CM | POA: Diagnosis not present

## 2016-11-06 DIAGNOSIS — I10 Essential (primary) hypertension: Secondary | ICD-10-CM | POA: Insufficient documentation

## 2016-11-06 DIAGNOSIS — Z7982 Long term (current) use of aspirin: Secondary | ICD-10-CM | POA: Diagnosis not present

## 2016-11-06 DIAGNOSIS — R21 Rash and other nonspecific skin eruption: Secondary | ICD-10-CM | POA: Diagnosis present

## 2016-11-06 DIAGNOSIS — I2510 Atherosclerotic heart disease of native coronary artery without angina pectoris: Secondary | ICD-10-CM | POA: Diagnosis not present

## 2016-11-06 MED ORDER — METHYLPREDNISOLONE 4 MG PO TBPK: ORAL_TABLET | ORAL | 0 refills | Status: AC

## 2016-11-06 MED ORDER — DEXAMETHASONE SODIUM PHOSPHATE 10 MG/ML IJ SOLN
10.0000 mg | Freq: Once | INTRAMUSCULAR | Status: AC
  Administered 2016-11-06: 10 mg via INTRAMUSCULAR
  Filled 2016-11-06: qty 1

## 2016-11-06 MED ORDER — HYDROXYZINE HCL 50 MG PO TABS
50.0000 mg | ORAL_TABLET | Freq: Three times a day (TID) | ORAL | 0 refills | Status: AC | PRN
Start: 2016-11-06 — End: ?

## 2016-11-06 MED ORDER — HYDROXYZINE HCL 50 MG PO TABS
50.0000 mg | ORAL_TABLET | Freq: Once | ORAL | Status: AC
  Administered 2016-11-06: 50 mg via ORAL
  Filled 2016-11-06: qty 1

## 2016-11-06 NOTE — ED Provider Notes (Signed)
[facility] Emergency Department Provider Note   ____________________________________________   First MD Initiated Contact with Patient 11/06/16 1032     (approximate)  I have reviewed the triage vital signs and the nursing notes.   HISTORY  Chief Complaint Rash    HPI Adrian Terry is a 46 y.o. male patient complaining a rash to the back of his neck and bilateral arms 1 week. Patient state rash appears status post yardwork. Patient no relief using over-the-counter topical preparations. Patient stated this itching.  Past Medical History:  Diagnosis Date  . Coronary artery disease    Small non-ST elevation myocardial infarction in December 2015. Cardiac catheterization showed no evidence of obstructive coronary artery disease. Significant coronary ectasia was noted especially in the right coronary artery with TIMI 2 flow. Ejection fraction was 55%.  . Essential hypertension   . Gastro-esophageal reflux   . Hyperlipidemia   . MI (myocardial infarction) [facility](HCC)     Patient Active Problem List   Diagnosis Date Noted  . Coronary artery disease   . Hyperlipidemia   . Essential hypertension     Past Surgical History:  Procedure Laterality Date  . CARDIAC CATHETERIZATION     [facility]  . CARDIAC CATHETERIZATION  01/2014    Prior to Admission medications   Medication Sig Start Date End Date Taking? Authorizing Provider  amLODipine (NORVASC) 10 MG tablet Take 1 tablet (10 mg total) by mouth daily. 11/09/15   Sondra Bargesunn, Ryan M, PA-C  aspirin 81 MG tablet Take 81 mg by mouth daily.    [provider]  atorvastatin (LIPITOR) 80 MG tablet Take 80 mg by mouth daily.    [provider]  hydrOXYzine (ATARAX/VISTARIL) 50 MG tablet Take 1 tablet (50 mg total) by mouth 3 (three) times daily as needed. 11/06/16   Joni ReiningSmith, Arif Amendola K, PA-C  meclizine (ANTIVERT) 25 MG tablet Take 1 tablet (25 mg total) by mouth 3  (three) times daily as needed for dizziness. 10/30/15   Phineas SemenGoodman, Graydon, MD  methylPREDNISolone (MEDROL DOSEPAK) 4 MG TBPK tablet Take Tapered dose as directed 11/06/16   Joni ReiningSmith, Deyon Chizek K, PA-C  metoprolol (LOPRESSOR) 50 MG tablet Take 50 mg by mouth daily.     [provider]  nitroGLYCERIN (NITROSTAT) 0.4 MG SL tablet Place 0.4 mg under the tongue every 5 (five) minutes as needed for chest pain.    [provider]  omeprazole (PRILOSEC) 20 MG capsule Take 20 mg by mouth 2 (two) times daily before a meal.    [provider]    Allergies Patient has no known allergies.  Family History  Problem Relation Age of Onset  . Heart disease Mother     Social History Social History  Substance Use Topics  . Smoking status: Never Smoker  . Smokeless tobacco: Never Used  . Alcohol use Yes     Comment: socially    Review of Systems  Constitutional: No fever/chills Eyes: No visual changes. ENT: No sore throat. Cardiovascular: Denies chest pain. Respiratory: Denies shortness of breath. Gastrointestinal: No abdominal pain.  No nausea, no vomiting.  No diarrhea.  No constipation. Genitourinary: Negative for dysuria. Musculoskeletal: Negative for back pain. Skin: Negative for rash. Neurological: Negative for headaches, focal weakness or numbness. Endocrine:Hyperlipidemia and hypertension  ____________________________________________   PHYSICAL EXAM:  VITAL SIGNS: ED Triage Vitals  Enc Vitals Group     BP      Pulse  Resp      Temp      Temp src      SpO2      Weight      Height      Head Circumference      Peak Flow      Pain Score      Pain Loc      Pain Edu?      Excl. in GC?     Constitutional: Alert and oriented. Well appearing and in no acute distress. Cardiovascular: Normal rate, regular rhythm. Grossly normal heart sounds.  Good peripheral circulation. Respiratory: Normal respiratory effort.  No retractions. Lungs CTAB. Neurologic:   Normal speech and language. No gross focal neurologic deficits are appreciated. No gait instability. Skin:  Skin is warm, dry and intact. No rash noted. Vascular lesion on erythematous base bilateral upper extremities and posterior neck. Psychiatric: Mood and affect are normal. Speech and behavior are normal.  ____________________________________________   LABS (all labs ordered are listed, but only abnormal results are displayed)  Labs Reviewed - No data to display ____________________________________________  EKG   ____________________________________________  RADIOLOGY  No results found.  ____________________________________________   PROCEDURES  Procedure(s) performed: None  Procedures  Critical Care performed: No  ____________________________________________   INITIAL IMPRESSION / ASSESSMENT AND PLAN / ED COURSE  Pertinent labs & imaging results that were available during my care of the patient were reviewed by me and considered in my medical decision making (see chart for details).  Rash secondary to contact dermatitis. Patient given discharge care instruction. Patient was take medication as directed. Patient [facility].      ____________________________________________   FINAL CLINICAL IMPRESSION(S) / ED DIAGNOSES  Final diagnoses:  Irritant contact dermatitis, unspecified trigger      NEW MEDICATIONS STARTED DURING THIS VISIT:  New Prescriptions   HYDROXYZINE (ATARAX/VISTARIL) 50 MG TABLET    Take 1 tablet (50 mg total) by mouth 3 (three) times daily as needed.   METHYLPREDNISOLONE (MEDROL DOSEPAK) 4 MG TBPK TABLET    Take Tapered dose as directed     Note:  This document was prepared using Dragon voice recognition software and may include unintentional dictation errors.    Joni Reining, PA-C 11/06/16 1047    Nita Sickle, MD 11/06/16 [phone]

## 2016-11-06 NOTE — ED Triage Notes (Signed)
Presents with rash to both arms for about 1 week

## 2017-09-09 ENCOUNTER — Encounter: Payer: Self-pay | Admitting: Emergency Medicine

## 2017-09-09 ENCOUNTER — Emergency Department
Admission: EM | Admit: 2017-09-09 | Discharge: 2017-09-09 | Disposition: A | Discharge status: Home / Self Care | Payer: Medicaid Other | Attending: Emergency Medicine | Admitting: Emergency Medicine

## 2017-09-09 DIAGNOSIS — F1210 Cannabis abuse, uncomplicated: Secondary | ICD-10-CM | POA: Diagnosis not present

## 2017-09-09 DIAGNOSIS — Y998 Other external cause status: Secondary | ICD-10-CM | POA: Insufficient documentation

## 2017-09-09 DIAGNOSIS — T63441A Toxic effect of venom of bees, accidental (unintentional), initial encounter: Secondary | ICD-10-CM | POA: Diagnosis not present

## 2017-09-09 DIAGNOSIS — Y929 Unspecified place or not applicable: Secondary | ICD-10-CM | POA: Insufficient documentation

## 2017-09-09 DIAGNOSIS — I252 Old myocardial infarction: Secondary | ICD-10-CM | POA: Insufficient documentation

## 2017-09-09 DIAGNOSIS — Z79899 Other long term (current) drug therapy: Secondary | ICD-10-CM | POA: Diagnosis not present

## 2017-09-09 DIAGNOSIS — I10 Essential (primary) hypertension: Secondary | ICD-10-CM | POA: Insufficient documentation

## 2017-09-09 DIAGNOSIS — W57XXXA Bitten or stung by nonvenomous insect and other nonvenomous arthropods, initial encounter: Secondary | ICD-10-CM | POA: Diagnosis not present

## 2017-09-09 DIAGNOSIS — Z7982 Long term (current) use of aspirin: Secondary | ICD-10-CM | POA: Diagnosis not present

## 2017-09-09 DIAGNOSIS — Y9389 Activity, other specified: Secondary | ICD-10-CM | POA: Insufficient documentation

## 2017-09-09 DIAGNOSIS — I2510 Atherosclerotic heart disease of native coronary artery without angina pectoris: Secondary | ICD-10-CM | POA: Diagnosis not present

## 2017-09-09 MED ORDER — DEXAMETHASONE SODIUM PHOSPHATE 10 MG/ML IJ SOLN
10.0000 mg | Freq: Once | INTRAMUSCULAR | Status: AC
  Administered 2017-09-09: 10 mg via INTRAMUSCULAR
  Filled 2017-09-09: qty 1

## 2017-09-09 NOTE — ED Notes (Signed)
No swelling or rash to the left arm where pt states he was bitten by what he thinks was a wasp. Pt denies any difficulties breathing, or trouble swallowing at this time. States area was burning on the forearm earlier but has since stopped.

## 2017-09-09 NOTE — Discharge Instructions (Addendum)
Follow-up with your primary care provider if any continued problems.  You may take Benadryl at home as needed for itching.  You may take 1 or 2 capsules every 6 hours if needed.  May also use cortisone cream directly to the area.  Return to the emergency department if any severe worsening of your symptoms.

## 2017-09-09 NOTE — ED Provider Notes (Signed)
[facility] Emergency Department Provider Note  ____________________________________________   First MD Initiated Contact with Patient 09/09/17 1618     (approximate)  I have reviewed the triage vital signs and the nursing notes.   HISTORY  Chief Complaint Insect Bite   HPI Adrian Terry is a 47 y.o. male presents to the ED after being stung by a black bee 20 minutes prior to arrival.  Patient states that he is very anxious because he had a reaction when he was younger.  He states that he felt his throat getting dry but never felt as if he could not swallow or talk.  He denies any difficulty breathing.  He has not taken any over-the-counter medication.  He states that there were 3 hornets or another type of bee.  He was swatting at all 3 and got stung by 1.  He rates his pain as 4 out of 10.   Past Medical History:  Diagnosis Date  . Coronary artery disease    Small non-ST elevation myocardial infarction in December 2015. Cardiac catheterization showed no evidence of obstructive coronary artery disease. Significant coronary ectasia was noted especially in the right coronary artery with TIMI 2 flow. Ejection fraction was 55%.  . Essential hypertension   . Gastro-esophageal reflux   . Hyperlipidemia   . MI (myocardial infarction) [facility](HCC)     Patient Active Problem List   Diagnosis Date Noted  . Coronary artery disease   . Hyperlipidemia   . Essential hypertension     Past Surgical History:  Procedure Laterality Date  . CARDIAC CATHETERIZATION     [facility]  . CARDIAC CATHETERIZATION  01/2014    Prior to Admission medications   Medication Sig Start Date End Date Taking? Authorizing Provider  amLODipine (NORVASC) 10 MG tablet Take 1 tablet (10 mg total) by mouth daily. 11/09/15   Sondra Bargesunn, Ryan M, PA-C  aspirin 81 MG tablet Take 81 mg by mouth daily.    [provider]  atorvastatin (LIPITOR) 80 MG  tablet Take 80 mg by mouth daily.    [provider]  hydrOXYzine (ATARAX/VISTARIL) 50 MG tablet Take 1 tablet (50 mg total) by mouth 3 (three) times daily as needed. 11/06/16   Joni ReiningSmith, Ronald K, PA-C  meclizine (ANTIVERT) 25 MG tablet Take 1 tablet (25 mg total) by mouth 3 (three) times daily as needed for dizziness. 10/30/15   Phineas SemenGoodman, Graydon, MD  methylPREDNISolone (MEDROL DOSEPAK) 4 MG TBPK tablet Take Tapered dose as directed 11/06/16   Joni ReiningSmith, Ronald K, PA-C  metoprolol (LOPRESSOR) 50 MG tablet Take 50 mg by mouth daily.     [provider]  nitroGLYCERIN (NITROSTAT) 0.4 MG SL tablet Place 0.4 mg under the tongue every 5 (five) minutes as needed for chest pain.    [provider]  omeprazole (PRILOSEC) 20 MG capsule Take 20 mg by mouth 2 (two) times daily before a meal.    [provider]    Allergies Patient has no known allergies.  Family History  Problem Relation Age of Onset  . Heart disease Mother     Social History Social History   Tobacco Use  . Smoking status: Never Smoker  . Smokeless tobacco: Never Used  Substance Use Topics  . Alcohol use: Yes    Comment: socially  . Drug use: Yes    Types: Marijuana    Comment: occasionally    Review of Systems Constitutional: No fever/chills Eyes:  No visual changes. ENT: No sore throat.  Negative for difficulty swallowing. Cardiovascular: Denies chest pain. Respiratory: Denies shortness of breath. Gastrointestinal:  No nausea, no vomiting.  Musculoskeletal: Negative for back pain. Skin: Positive for bee sting. Neurological: Negative for headaches, focal weakness or numbness. ____________________________________________   PHYSICAL EXAM:  VITAL SIGNS: ED Triage Vitals  Enc Vitals Group     BP 09/09/17 1517 (!) 144/90     Pulse Rate 09/09/17 1517 69     Resp 09/09/17 1517 20     Temp 09/09/17 1517 98.5 F (36.9 C)     Temp Source 09/09/17 1517 Oral     SpO2 09/09/17 1517 96 %      Weight 09/09/17 1518 225 lb (102.1 kg)     Height 09/09/17 1518 5\' 11"  (1.803 m)     Head Circumference --      Peak Flow --      Pain Score 09/09/17 1517 4     Pain Loc --      Pain Edu? --      Excl. in GC? --    Constitutional: Alert and oriented. Well appearing and in no acute distress. Eyes: Conjunctivae are normal.  Head: Atraumatic. Mouth/Throat: Mucous membranes are moist.  Oropharynx non-erythematous.  No edema. Neck: No stridor.   Cardiovascular: Normal rate, regular rhythm. Grossly normal heart sounds.  Good peripheral circulation. Respiratory: Normal respiratory effort.  No retractions. Lungs CTAB. Musculoskeletal: Able to move upper and lower extremities without any difficulty and normal gait was noted.  There is no difficulty with range of motion of his right upper extremity.  There is localized skin edema that measures approximately 1 cm.  No drainage is noted. Neurologic:  Normal speech and language. No gross focal neurologic deficits are appreciated. No gait instability. Skin:  Skin is warm, dry and intact.  Psychiatric: Mood and affect are normal. Speech and behavior are normal.  ____________________________________________   LABS (all labs ordered are listed, but only abnormal results are displayed)  Labs Reviewed - No data to display   PROCEDURES  Procedure(s) performed: None  Procedures  Critical Care performed: No  ____________________________________________   INITIAL IMPRESSION / ASSESSMENT AND PLAN / ED COURSE  As part of my medical decision making, I reviewed the following data within the electronic MEDICAL RECORD NUMBER Notes from prior ED visits and [place] Controlled Substance Database  Patient was given Decadron 10 mg IM to reduce his chances of swelling in his arm as patient is very anxious about having a reaction.  He is encouraged to take Benadryl 1 or 2 capsules every 6 hours when at home.  He may also use cortisone cream to the area as needed for  itching.  He is to follow-up with his PCP if any continued problems he is to return to the emergency department if any severe worsening of his condition.  ____________________________________________   FINAL CLINICAL IMPRESSION(S) / ED DIAGNOSES  Final diagnoses:  Bee sting, accidental or unintentional, initial encounter     ED Discharge Orders    None       Note:  This document was prepared using Dragon voice recognition software and may include unintentional dictation errors.    Tommi Rumps, PA-C 09/09/17 1731    Phineas Semen, MD 09/09/17 [phone]

## 2017-09-09 NOTE — ED Triage Notes (Signed)
Pt reports was stung by a bee approximately 15-20 minutes ago. Pt states that when he was younger he had a bad reaction to a bee sting. Pt reports his right arm where he was stung he can feel a small stinging feeling but denies breathing difficulties or feeling like his throat is swelling or closing. He does feel like his throat is dry.

## 2023-07-10 ENCOUNTER — Emergency Department
Admission: EM | Admit: 2023-07-10 | Discharge: 2023-07-10 | Disposition: A | Discharge status: Home / Self Care | Attending: Emergency Medicine | Admitting: Emergency Medicine

## 2023-07-10 ENCOUNTER — Other Ambulatory Visit: Payer: Self-pay

## 2023-07-10 ENCOUNTER — Emergency Department

## 2023-07-10 ENCOUNTER — Encounter: Payer: Self-pay | Admitting: Emergency Medicine

## 2023-07-10 DIAGNOSIS — I160 Hypertensive urgency: Secondary | ICD-10-CM | POA: Insufficient documentation

## 2023-07-10 DIAGNOSIS — I10 Essential (primary) hypertension: Secondary | ICD-10-CM | POA: Insufficient documentation

## 2023-07-10 DIAGNOSIS — E876 Hypokalemia: Secondary | ICD-10-CM | POA: Insufficient documentation

## 2023-07-10 DIAGNOSIS — I2510 Atherosclerotic heart disease of native coronary artery without angina pectoris: Secondary | ICD-10-CM | POA: Diagnosis not present

## 2023-07-10 DIAGNOSIS — R030 Elevated blood-pressure reading, without diagnosis of hypertension: Secondary | ICD-10-CM | POA: Diagnosis present

## 2023-07-10 DIAGNOSIS — F439 Reaction to severe stress, unspecified: Secondary | ICD-10-CM | POA: Insufficient documentation

## 2023-07-10 DIAGNOSIS — Z7982 Long term (current) use of aspirin: Secondary | ICD-10-CM | POA: Diagnosis not present

## 2023-07-10 DIAGNOSIS — Z79899 Other long term (current) drug therapy: Secondary | ICD-10-CM | POA: Diagnosis not present

## 2023-07-10 DIAGNOSIS — R252 Cramp and spasm: Secondary | ICD-10-CM | POA: Diagnosis not present

## 2023-07-10 LAB — COMPREHENSIVE METABOLIC PANEL WITH GFR
ALT: 26 U/L (ref 0–44)
AST: 16 U/L (ref 15–41)
Albumin: 4.1 g/dL (ref 3.5–5.0)
Alkaline Phosphatase: 36 U/L — ABNORMAL LOW (ref 38–126)
Anion gap: 9 (ref 5–15)
BUN: 10 mg/dL (ref 6–20)
CO2: 25 mmol/L (ref 22–32)
Calcium: 9 mg/dL (ref 8.9–10.3)
Chloride: 103 mmol/L (ref 98–111)
Creatinine, Ser: 1.01 mg/dL (ref 0.61–1.24)
GFR, Estimated: >60 mL/min (ref >60)
Glucose, Bld: 125 mg/dL — ABNORMAL HIGH (ref 70–99)
Potassium: 3.4 mmol/L — ABNORMAL LOW (ref 3.5–5.1)
Sodium: 137 mmol/L (ref 135–145)
Total Bilirubin: 1 mg/dL (ref 0.0–1.2)
Total Protein: 7.5 g/dL (ref 6.5–8.1)

## 2023-07-10 LAB — CBC WITH DIFFERENTIAL/PLATELET
Abs Immature Granulocytes: 0.01 10*3/uL (ref 0.00–0.07)
Basophils Absolute: 0.1 10*3/uL (ref 0.0–0.1)
Basophils Relative: 1 %
Eosinophils Absolute: 0.1 10*3/uL (ref 0.0–0.5)
Eosinophils Relative: 1 %
HCT: 44 % (ref 39.0–52.0)
Hemoglobin: 14.8 g/dL (ref 13.0–17.0)
Immature Granulocytes: 0 %
Lymphocytes Relative: 25 %
Lymphs Abs: 1.4 10*3/uL (ref 0.7–4.0)
MCH: 29.3 pg (ref 26.0–34.0)
MCHC: 33.6 g/dL (ref 30.0–36.0)
MCV: 87.1 fL (ref 80.0–100.0)
Monocytes Absolute: 0.7 10*3/uL (ref 0.1–1.0)
Monocytes Relative: 13 %
Neutro Abs: 3.4 10*3/uL (ref 1.7–7.7)
Neutrophils Relative %: 60 %
Platelets: 237 10*3/uL (ref 150–400)
RBC: 5.05 MIL/uL (ref 4.22–5.81)
RDW: 12.7 % (ref 11.5–15.5)
WBC: 5.6 10*3/uL (ref 4.0–10.5)
nRBC: 0 % (ref 0.0–0.2)

## 2023-07-10 LAB — D-DIMER, QUANTITATIVE: D-Dimer, Quant: 0.29 ug{FEU}/mL (ref 0.00–0.50)

## 2023-07-10 LAB — TROPONIN I (HIGH SENSITIVITY)
Troponin I (High Sensitivity): 6 ng/L (ref <18)
Troponin I (High Sensitivity): 7 ng/L (ref <18)

## 2023-07-10 LAB — CK: Total CK: 182 U/L (ref 49–397)

## 2023-07-10 LAB — LIPASE, BLOOD: Lipase: 37 U/L (ref 11–51)

## 2023-07-10 LAB — MAGNESIUM: Magnesium: 1.6 mg/dL — ABNORMAL LOW (ref 1.7–2.4)

## 2023-07-10 MED ORDER — HYDRALAZINE HCL 20 MG/ML IJ SOLN
10.0000 mg | Freq: Once | INTRAMUSCULAR | Status: AC
Start: 2023-07-10 — End: 2023-07-10
  Administered 2023-07-10: 10 mg via INTRAVENOUS
  Filled 2023-07-10: qty 1

## 2023-07-10 MED ORDER — POTASSIUM CHLORIDE CRYS ER 20 MEQ PO TBCR
40.0000 meq | EXTENDED_RELEASE_TABLET | Freq: Once | ORAL | Status: AC
  Administered 2023-07-10: 40 meq via ORAL
  Filled 2023-07-10: qty 2

## 2023-07-10 MED ORDER — MAGNESIUM SULFATE 2 GM/50ML IV SOLN
2.0000 g | Freq: Once | INTRAVENOUS | Status: AC
  Administered 2023-07-10: 2 g via INTRAVENOUS
  Filled 2023-07-10: qty 50

## 2023-07-10 MED ORDER — ONDANSETRON HCL 4 MG/2ML IJ SOLN
4.0000 mg | Freq: Once | INTRAMUSCULAR | Status: AC
  Administered 2023-07-10: 4 mg via INTRAVENOUS
  Filled 2023-07-10: qty 2

## 2023-07-10 NOTE — ED Notes (Signed)
 Pt transported to CT at this time.

## 2023-07-10 NOTE — ED Triage Notes (Addendum)
 Patient ambulatory to triage with steady gait, without difficulty or distress noted; pt reports having HTN (179/120) this morning; d/c recently for same (traveling from out of town); denies specific symptoms, just doesn't feel good

## 2023-07-10 NOTE — ED Provider Notes (Addendum)
 [facility] Provider Note    Event Date/Time   First MD Initiated Contact with Patient 07/10/23 [phone]     (approximate)   History   Hypertension   HPI  UNKNOWN Adrian Terry is a 53 y.o. male who presents to the ED with a chief complaint of not feeling well and elevated blood pressure 179/120 this morning.  Patient with a history of CAD status post MI, hypertension who was hospitalized at the beginning of this month in his home state of [place]  for hypertensive urgency, non-STEMI.  LHC negative for coronary artery blockage.  In the last 2 weeks, patient has traveled from [place]  home to [place]  state then back to [place]  where he is currently for a family member's funeral.  Endorses stress.  Reports generalized malaise, nothing specific.  Denies headache, vision changes, neck pain, chest pain, shortness of breath, abdominal pain, nausea, vomiting, dizziness.  Endorses left anterior thigh cramps.  LHC through right groin.  Endorses compliance with his medications.  Hydralazine and spironolactone were added on his most recent hospitalization.     Past Medical History   Past Medical History:  Diagnosis Date   Coronary artery disease    Small non-ST elevation myocardial infarction in December 2015. Cardiac catheterization showed no evidence of obstructive coronary artery disease. Significant coronary ectasia was noted especially in the right coronary artery with TIMI 2 flow. Ejection fraction was 55%.   Essential hypertension    Gastro-esophageal reflux    Hyperlipidemia    MI (myocardial infarction) [facility])      Active Problem List   Patient Active Problem List   Diagnosis Date Noted   Coronary artery disease    Hyperlipidemia    Essential hypertension      Past Surgical History   Past Surgical History:  Procedure Laterality Date   CARDIAC CATHETERIZATION     x1 stent; [facility] [place]   CARDIAC CATHETERIZATION   01/2014     Home Medications   Prior to Admission medications   Medication Sig Start Date End Date Taking? Authorizing Provider  amLODipine  (NORVASC ) 10 MG tablet Take 1 tablet (10 mg total) by mouth daily. 11/09/15   Roark Chick, PA-C  aspirin 81 MG tablet Take 81 mg by mouth daily.    [provider]  atorvastatin (LIPITOR) 80 MG tablet Take 80 mg by mouth daily.    [provider]  hydrOXYzine  (ATARAX /VISTARIL ) 50 MG tablet Take 1 tablet (50 mg total) by mouth 3 (three) times daily as needed. 11/06/16   Marcina Severe, PA-C  meclizine  (ANTIVERT ) 25 MG tablet Take 1 tablet (25 mg total) by mouth 3 (three) times daily as needed for dizziness. 10/30/15   Marylynn Soho, MD  methylPREDNISolone  (MEDROL  DOSEPAK) 4 MG TBPK tablet Take Tapered dose as directed 11/06/16   Marcina Severe, PA-C  metoprolol (LOPRESSOR) 50 MG tablet Take 50 mg by mouth daily.     [provider]  nitroGLYCERIN (NITROSTAT) 0.4 MG SL tablet Place 0.4 mg under the tongue every 5 (five) minutes as needed for chest pain.    [provider]  omeprazole (PRILOSEC) 20 MG capsule Take 20 mg by mouth 2 (two) times daily before a meal.    [provider]     Allergies  Patient has no known allergies.   Family History   Family History  Problem Relation Age of Onset   Heart disease Mother      Physical  Exam  Triage Vital Signs: ED Triage Vitals [07/10/23 0505]  Encounter Vitals Group     BP      Systolic BP Percentile      Diastolic BP Percentile      Pulse      Resp      Temp      Temp src      SpO2      Weight 219 lb (99.3 kg)     Height 5\' 11"  (1.803 m)     Head Circumference      Peak Flow      Pain Score 0     Pain Loc      Pain Education      Exclude from Growth Chart     Updated Vital Signs: BP 138/87   Pulse 94   Temp 98.2 F (36.8 C) (Oral)   Resp 19   Ht 5\' 11"  (1.803 m)   Wt 99.3 kg   SpO2 94%   BMI 30.54 kg/m    General: Awake, mild  distress.  CV:  RRR.  Good peripheral perfusion.  Resp:  Normal effort.  CTAB. Abd:  Nontender.  No distention.  Other:  No thyromegaly.  Alert and oriented x 3.  CN II-XII grossly intact.  5/5 motor strength and sensation all extremities.  MAE x 4.  Left anterior thigh nontender to palpation, no external evidence of injury.   ED Results / Procedures / Treatments  Labs (all labs ordered are listed, but only abnormal results are displayed) Labs Reviewed  COMPREHENSIVE METABOLIC PANEL WITH GFR - Abnormal; Notable for the following components:      Result Value   Potassium 3.4 (*)    Glucose, Bld 125 (*)    Alkaline Phosphatase 36 (*)    All other components within normal limits  MAGNESIUM - Abnormal; Notable for the following components:   Magnesium 1.6 (*)    All other components within normal limits  CBC WITH DIFFERENTIAL/PLATELET  LIPASE, BLOOD  D-DIMER, QUANTITATIVE  URINALYSIS, ROUTINE W REFLEX MICROSCOPIC  CK  TROPONIN I (HIGH SENSITIVITY)  TROPONIN I (HIGH SENSITIVITY)     EKG  ED ECG REPORT I, Rusty Glodowski J, the attending physician, personally viewed and interpreted this ECG.   Date: 07/10/2023  EKG Time: 0526  Rate: 94  Rhythm: normal sinus rhythm  Axis: Normal  Intervals:none  ST&T Change: Inverted T waves inferior lateral leads Appear similar to EKG by report 07/02/2023   RADIOLOGY I have independently visualized and interpreted patient's imaging studies as well as noted the radiology interpretation:  Chest x-ray: No acute cardiopulmonary process, mild cardiomegaly  Ultrasound: No DVT  Official radiology report(s): US  Venous Img Lower Unilateral Left (DVT) Result Date: 07/10/2023 CLINICAL DATA:  53 year old male with calf pain. EXAM: LEFT LOWER EXTREMITY VENOUS DOPPLER ULTRASOUND TECHNIQUE: Gray-scale sonography with compression, as well as color and duplex ultrasound, were performed to evaluate the deep venous system(s) from the level of the common femoral  vein through the popliteal and proximal calf veins. COMPARISON:  None Available. FINDINGS: VENOUS Normal compressibility of the common femoral, superficial femoral, and popliteal veins, as well as the visualized calf veins. Visualized portions of profunda femoral vein and great saphenous vein unremarkable. No filling defects to suggest DVT on grayscale or color Doppler imaging. Doppler waveforms show normal direction of venous flow, normal respiratory plasticity and response to augmentation. Limited views of the contralateral common femoral vein are unremarkable. OTHER None. Limitations: none IMPRESSION: No evidence of  left lower extremity deep venous thrombosis. Electronically Signed   By: Marlise Simpers M.D.   On: 07/10/2023 06:46   DG Chest Port 1 View Result Date: 07/10/2023 CLINICAL DATA:  Abnormally high blood pressure. EXAM: PORTABLE CHEST 1 VIEW COMPARISON:  Portable chest 05/21/2014. FINDINGS: The heart has mildly enlarged since the prior study. Central vessels are normal caliber. The lungs are clear. The sulci are sharp. The mediastinum is normally outlined. Thoracic cage unremarkable. Multiple overlying telemetry leads. IMPRESSION: No evidence of acute chest disease. Mild cardiomegaly. Electronically Signed   By: Denman Fischer M.D.   On: 07/10/2023 05:48     PROCEDURES:  Critical Care performed: Yes, see critical care procedure note(s)  CRITICAL CARE Performed by: Norlene Beavers   Total critical care time: 30 minutes  Critical care time was exclusive of separately billable procedures and treating other patients.  Critical care was necessary to treat or prevent imminent or life-threatening deterioration.  Critical care was time spent personally by me on the following activities: development of treatment plan with patient and/or surrogate as well as nursing, discussions with consultants, evaluation of patient's response to treatment, examination of patient, obtaining history from patient or  surrogate, ordering and performing treatments and interventions, ordering and review of laboratory studies, ordering and review of radiographic studies, pulse oximetry and re-evaluation of patient's condition.   Aaron Aas1-3 Lead EKG Interpretation  Performed by: Norlene Beavers, MD Authorized by: Norlene Beavers, MD     Interpretation: normal     ECG rate:  92   ECG rate assessment: normal     Rhythm: sinus rhythm     Ectopy: none     Conduction: normal   Comments:     Patient placed on cardiac monitor to evaluate for arrhythmias    MEDICATIONS ORDERED IN ED: Medications  magnesium sulfate IVPB 2 g 50 mL (has no administration in time range)  potassium chloride SA (KLOR-CON M) CR tablet 40 mEq (has no administration in time range)  hydrALAZINE (APRESOLINE) injection 10 mg (10 mg Intravenous Given 07/10/23 0543)     IMPRESSION / MDM / ASSESSMENT AND PLAN / ED COURSE  I reviewed the triage vital signs and the nursing notes.                             53 year old male presenting with generalized malaise, elevated blood pressure.  Differential diagnosis includes but is not limited to ACS, PE, dissection, infectious, metabolic etiologies, etc.  I personally reviewed patient's records and note his hospitalization in [place] from 5/6 - 07/03/2023 along with his clean left heart cath report.  Patient's presentation is most consistent with acute presentation with potential threat to life or bodily function.  The patient is on the cardiac monitor to evaluate for evidence of arrhythmia and/or significant heart rate changes.  Patient took an extra 50 mg Hydralazine around midnight.  Will obtain cardiac panel, chest x-ray, LLE DVT ultrasound.  Administer IV hydralazine and reassess.  Clinical Course as of 07/10/23 0649  Thu Jul 10, 2023  0644 Initial laboratory results demonstrate minimal hypokalemia with potassium 3.4, hypomagnesemia with magnesium 1.6.  Patient's spouse does note these levels  were low on recent hospitalization.  Electrolytes, D-dimer, initial troponin unremarkable.  Chest x-ray is clear.  Blood pressure 138/87.  Patient currently complains of mild global headache.  Plan to obtain CT head, repeat timed troponin, CK.  Care will be transferred to the  oncoming provider at change of shift.  If those are unremarkable and patient maintaining better blood pressure and feeling better, I anticipate he may be discharged home. [JS]    Clinical Course User Index [JS] Norlene Beavers, MD     FINAL CLINICAL IMPRESSION(S) / ED DIAGNOSES   Final diagnoses:  Hypertensive urgency  Hypomagnesemia  Hypokalemia     Rx / DC Orders   ED Discharge Orders     None        Note:  This document was prepared using Dragon voice recognition software and may include unintentional dictation errors.   Zaelyn Noack J, MD 07/10/23 [id number]    Norlene Beavers, MD 07/10/23 [phone]

## 2023-07-10 NOTE — ED Notes (Signed)
 Pt had an episode of nausea, without vomiting after PO medications. Dr. Alejo Amsler made aware see new orders.

## 2023-07-10 NOTE — ED Provider Notes (Signed)
 Emergency department handoff note  Care of this patient was signed out to me at the end of the previous provider shift.  All pertinent patient information was conveyed and all questions were answered.  Patient pending head CT which showed no evidence of acute abnormalities.  Patient's blood pressure remained controlled The patient has been reexamined and is ready to be discharged.  All diagnostic results have been reviewed and discussed with the patient/family.  Care plan has been outlined and the patient/family understands all current diagnoses, results, and treatment plans.  There are no new complaints, changes, or physical findings at this time.  All questions have been addressed and answered.  Patient was instructed to, and agrees to follow-up with their primary care physician as well as return to the emergency department if any new or worsening symptoms develop.   Mandalyn Pasqua K, MD 07/10/23 [phone]

## 2023-07-10 NOTE — ED Notes (Signed)
 CCMD called to initiate cardiac monitoring
